# Patient Record
Sex: Female | Born: 1995 | Race: White | Hispanic: No | Marital: Married | Smoking: Never smoker
Health system: Southern US, Community
[De-identification: ages and names within clinical notes are randomized; demographics above are authoritative.]

## PROBLEM LIST (undated history)

## (undated) DIAGNOSIS — T8859XA Other complications of anesthesia, initial encounter: Secondary | ICD-10-CM

## (undated) DIAGNOSIS — R011 Cardiac murmur, unspecified: Secondary | ICD-10-CM

## (undated) HISTORY — PX: WISDOM TOOTH EXTRACTION: SHX21

## (undated) HISTORY — DX: Other complications of anesthesia, initial encounter: T88.59XA

## (undated) HISTORY — DX: Cardiac murmur, unspecified: R01.1

---
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
## 2014-06-01 ENCOUNTER — Ambulatory Visit (INDEPENDENT_AMBULATORY_CARE_PROVIDER_SITE_OTHER): Payer: 59 | Admitting: Nurse Practitioner

## 2014-06-01 ENCOUNTER — Encounter: Payer: Self-pay | Admitting: Nurse Practitioner

## 2014-06-01 VITALS — BP 108/76 | Temp 98.6°F | Ht 64.0 in | Wt 123.0 lb

## 2014-06-01 DIAGNOSIS — J069 Acute upper respiratory infection, unspecified: Secondary | ICD-10-CM

## 2014-06-01 DIAGNOSIS — B9689 Other specified bacterial agents as the cause of diseases classified elsewhere: Secondary | ICD-10-CM

## 2014-06-01 MED ORDER — CEFDINIR 300 MG PO CAPS: 300.0000 mg | ORAL_CAPSULE | Freq: Two times a day (BID) | ORAL | Status: DC

## 2014-06-01 NOTE — Patient Instructions (Signed)
Restart Nasonex and antihistamines

## 2014-06-03 ENCOUNTER — Encounter: Payer: Self-pay | Admitting: Nurse Practitioner

## 2014-06-03 NOTE — Progress Notes (Signed)
Subjective:  Presents for c/o ear pressure, occasional cough, runny nose and mild dizziness x 1 week. No fever. Sore throat with cough. Clear mucus. No headache. No wheezing.   Objective:   BP 108/76 mmHg  Temp(Src) 98.6 F (37 C)  Ht 5\' 4"  (1.626 m)  Wt 123 lb (55.792 kg)  BMI 21.10 kg/m2 NAD. Alert, oriented. TMs clear effusion. Pharynx mild erythema with green PND. Neck supple with mild adenopathy. Lungs clear. Heart RRR.  Assessment: Bacterial upper respiratory infection  Plan:  Meds ordered this encounter  Medications  . cefdinir (OMNICEF) 300 MG capsule    Sig: Take 1 capsule (300 mg total) by mouth 2 (two) times daily. For 10 days    Dispense:  20 capsule    Refill:  0    Order Specific Question:  Supervising Provider    Answer:  Mikey Kirschner [[id number]]   Restart Nasonex as directed. OTC antihistamine as directed. Call back if worsens or persists.

## 2014-08-01 ENCOUNTER — Ambulatory Visit (INDEPENDENT_AMBULATORY_CARE_PROVIDER_SITE_OTHER): Payer: 59 | Admitting: *Deleted

## 2014-08-01 DIAGNOSIS — Z111 Encounter for screening for respiratory tuberculosis: Secondary | ICD-10-CM | POA: Diagnosis not present

## 2014-08-03 LAB — TB SKIN TEST
Induration: 0 mm
TB Skin Test: NEGATIVE

## 2014-10-18 ENCOUNTER — Encounter: Payer: Self-pay | Admitting: Nurse Practitioner

## 2014-10-18 ENCOUNTER — Ambulatory Visit (INDEPENDENT_AMBULATORY_CARE_PROVIDER_SITE_OTHER): Payer: 59 | Admitting: Nurse Practitioner

## 2014-10-18 VITALS — BP 108/76 | Temp 98.7°F | Wt 119.0 lb

## 2014-10-18 DIAGNOSIS — J329 Chronic sinusitis, unspecified: Secondary | ICD-10-CM | POA: Diagnosis not present

## 2014-10-18 MED ORDER — AZITHROMYCIN 250 MG PO TABS: ORAL_TABLET | ORAL | Status: DC

## 2014-10-18 NOTE — Progress Notes (Signed)
Subjective:  Presents for c/o runny nose and sinus pressure for about a week. Sore throat, bilat ear pressure x 2 d. Now having yellow mucus. Rare cough. No wheezing. Slight headache. No fever. No vomiting, diarrhea or abd pain.  Objective:   BP 108/76 mmHg  Temp(Src) 98.7 F (37.1 C) (Oral)  Wt 119 lb (53.978 kg) NAD. Alert, oriented. TMs clear effusion. Pharynx non erythematous with green PND noted. Neck supple with mild anterior adenopathy. Lungs clear. Heart RRR.  Assessment: Rhinosinusitis  Plan:  Meds ordered this encounter  Medications  . azithromycin (ZITHROMAX Z-PAK) 250 MG tablet    Sig: Take 2 tablets (500 mg) on  Day 1,  followed by 1 tablet (250 mg) once daily on Days 2 through 5.    Dispense:  6 each    Refill:  0    Order Specific Question:  Supervising Provider    Answer:  Mikey Kirschner [[id number]]   Continue OTC meds as directed for congestion. Call back if worsens or persists.

## 2014-10-23 ENCOUNTER — Other Ambulatory Visit: Payer: Self-pay | Admitting: Nurse Practitioner

## 2014-10-23 ENCOUNTER — Telehealth: Payer: Self-pay | Admitting: Nurse Practitioner

## 2014-10-23 MED ORDER — AMOXICILLIN-POT CLAVULANATE 875-125 MG PO TABS: 1.0000 | ORAL_TABLET | Freq: Two times a day (BID) | ORAL | Status: DC

## 2014-10-23 NOTE — Telephone Encounter (Signed)
NTC: any fever? Color to mucus?

## 2014-10-23 NOTE — Telephone Encounter (Signed)
Pt seen by Hoyle Sauer on 7/27 she was told to call back if she was not better  Current symptoms, runny nose, productive cough  Finished zpak last night   [place] [facility]

## 2014-10-23 NOTE — Telephone Encounter (Signed)
Patient has no fever but drainage is yellow /green

## 2014-11-14 ENCOUNTER — Ambulatory Visit (INDEPENDENT_AMBULATORY_CARE_PROVIDER_SITE_OTHER): Payer: 59 | Admitting: Family Medicine

## 2014-11-14 VITALS — Ht 64.0 in | Wt 120.2 lb

## 2014-11-14 DIAGNOSIS — R21 Rash and other nonspecific skin eruption: Secondary | ICD-10-CM | POA: Diagnosis not present

## 2014-11-14 MED ORDER — TRIAMCINOLONE ACETONIDE 0.1 % EX CREA: 1.0000 "application " | TOPICAL_CREAM | Freq: Two times a day (BID) | CUTANEOUS | Status: DC

## 2014-11-14 NOTE — Progress Notes (Signed)
   Subjective:    Patient ID: Veronica Allen, female    DOB: 18-Sep-1995, 19 y.o.   MRN: [id number]  HPI Patient arrives with c/o itchy rash on legs.  Patient was help her grandmother today. Was in her house. Next  Get into the household it was known to have an infestation with fleas earlier in the month.  The house and been treated.   Review of Systems No headache no fever no cough    Objective:   Physical Exam  Alert vitals stable. Lungs clear. Heart regular in rhythm. Legs multiple discrete papules with hypopigmented flare all underneath the edge of her slacks      Assessment & Plan:  Impression probable flea bites discussed plan triamcinolone cream twice a day to affected area. Symptom care discussed WSL

## 2015-02-13 ENCOUNTER — Ambulatory Visit (INDEPENDENT_AMBULATORY_CARE_PROVIDER_SITE_OTHER): Payer: 59 | Admitting: Nurse Practitioner

## 2015-02-13 ENCOUNTER — Encounter: Payer: Self-pay | Admitting: Nurse Practitioner

## 2015-02-13 VITALS — BP 100/70 | Ht 64.5 in | Wt 117.4 lb

## 2015-02-13 DIAGNOSIS — Z113 Encounter for screening for infections with a predominantly sexual mode of transmission: Secondary | ICD-10-CM | POA: Diagnosis not present

## 2015-02-13 DIAGNOSIS — Z0000 Encounter for general adult medical examination without abnormal findings: Secondary | ICD-10-CM | POA: Diagnosis not present

## 2015-02-13 MED ORDER — NORETHINDRONE ACET-ETHINYL EST 1-20 MG-MCG PO TABS: 1.0000 | ORAL_TABLET | Freq: Every day | ORAL | Status: DC

## 2015-02-14 ENCOUNTER — Encounter: Payer: Self-pay | Admitting: Nurse Practitioner

## 2015-02-14 NOTE — Progress Notes (Signed)
   Subjective:    Patient ID: Veronica Allen, female    DOB: 11/04/1995, 19 y.o.   MRN: [id number]  HPI presents for her wellness exam. Cycles are usually regular although this last 1 had spotting for about 25 days which has finally resolved. Has had 2 sexual partners. Uses condoms consistently. No pelvic pain or discharge. Would like to start birth control pills. Regular vision and dental exams. Does fairly well with her diet. No regular exercise.    Review of Systems  Constitutional: Negative for activity change, appetite change and fatigue.  HENT: Negative for dental problem, ear pain, sinus pressure and sore throat.   Respiratory: Negative for cough, chest tightness, shortness of breath and wheezing.   Cardiovascular: Negative for chest pain.  Gastrointestinal: Negative for nausea, vomiting, abdominal pain, diarrhea, constipation and abdominal distention.  Genitourinary: Positive for menstrual problem. Negative for dysuria, urgency, frequency, vaginal discharge, enuresis, difficulty urinating, genital sores and pelvic pain.       Objective:   Physical Exam  Constitutional: She is oriented to person, place, and time. She appears well-developed. No distress.  HENT:  Right Ear: External ear normal.  Left Ear: External ear normal.  Mouth/Throat: Oropharynx is clear and moist.  Neck: Normal range of motion. Neck supple. No tracheal deviation present. No thyromegaly present.  Cardiovascular: Normal rate, regular rhythm and normal heart sounds.  Exam reveals no gallop.   No murmur heard. Pulmonary/Chest: Effort normal and breath sounds normal.  Abdominal: Soft. She exhibits no distension. There is no tenderness.  Genitourinary:  Defers GU exam, denies any problems.  Musculoskeletal: She exhibits no edema.  Lymphadenopathy:    She has no cervical adenopathy.  Neurological: She is alert and oriented to person, place, and time.  Skin: Skin is warm and dry. No rash noted.    Psychiatric: She has a normal mood and affect. Her behavior is normal.  Vitals reviewed.  Breast exam: Slightly dense tissue, no masses; axilla no adenopathy.        Assessment & Plan:  Routine general medical examination at a health care facility  Screen for STD (sexually transmitted disease) - Plan: GC/Chlamydia Probe Amp  Discussed risks associated with OC use. Meds ordered this encounter  Medications  . norethindrone-ethinyl estradiol (MICROGESTIN,JUNEL,LOESTRIN) 1-20 MG-MCG tablet    Sig: Take 1 tablet by mouth daily.    Dispense:  3 Package    Refill:  3    Her mother will be calling to put this on her account    Order Specific Question:  Supervising Provider    Answer:  Mikey Kirschner [[id number]]   Start first Sunday after her next cycle begins. Reviewed safe sex issues. Encouraged healthy diet and regular exercise. Return in about 1 year (around 02/13/2016) for physical. Call back sooner if any problems.

## 2015-02-16 LAB — GC/CHLAMYDIA PROBE AMP
CHLAMYDIA, DNA PROBE: NEGATIVE
NEISSERIA GONORRHOEAE BY PCR: NEGATIVE

## 2015-05-24 ENCOUNTER — Ambulatory Visit: Payer: 59 | Admitting: Family Medicine

## 2015-06-05 ENCOUNTER — Other Ambulatory Visit: Payer: Self-pay | Admitting: Nurse Practitioner

## 2015-06-05 ENCOUNTER — Telehealth: Payer: Self-pay | Admitting: Nurse Practitioner

## 2015-06-05 MED ORDER — NORGESTIMATE-ETH ESTRADIOL 0.25-35 MG-MCG PO TABS: 1.0000 | ORAL_TABLET | Freq: Every day | ORAL | Status: DC

## 2015-06-05 NOTE — Telephone Encounter (Signed)
Veronica Allen  norethindrone-ethinyl estradiol (MICROGESTIN,JUNEL,LOESTRIN) 1-20 MG-MCG tablet  Pt is stating that the pill she is on has her menstrating 2 times a month   She would like to try something else, this is her fourth month   Outpatient

## 2015-06-05 NOTE — Telephone Encounter (Signed)
I went ahead and sent in new pill. Call back if breakthrough bleeding persists after 3 months or if any heavy or prolonged bleeding.

## 2015-06-05 NOTE — Telephone Encounter (Signed)
Mom was notified. 

## 2015-06-23 HISTORY — PX: WISDOM TOOTH EXTRACTION: SHX21

## 2016-04-09 ENCOUNTER — Other Ambulatory Visit: Payer: 59 | Admitting: Adult Health

## 2016-04-17 ENCOUNTER — Other Ambulatory Visit: Payer: 59 | Admitting: Adult Health

## 2016-04-24 ENCOUNTER — Ambulatory Visit (INDEPENDENT_AMBULATORY_CARE_PROVIDER_SITE_OTHER): Payer: 59 | Admitting: Adult Health

## 2016-04-24 ENCOUNTER — Encounter: Payer: Self-pay | Admitting: Adult Health

## 2016-04-24 ENCOUNTER — Other Ambulatory Visit (HOSPITAL_COMMUNITY)
Admission: RE | Admit: 2016-04-24 | Discharge: 2016-04-24 | Disposition: A | Discharge status: Home / Self Care | Payer: 59 | Source: Ambulatory Visit | Attending: Adult Health | Admitting: Adult Health

## 2016-04-24 VITALS — BP 110/72 | HR 76 | Ht 63.0 in | Wt 126.0 lb

## 2016-04-24 DIAGNOSIS — Z01419 Encounter for gynecological examination (general) (routine) without abnormal findings: Secondary | ICD-10-CM

## 2016-04-24 DIAGNOSIS — Z113 Encounter for screening for infections with a predominantly sexual mode of transmission: Secondary | ICD-10-CM | POA: Insufficient documentation

## 2016-04-24 DIAGNOSIS — Z3041 Encounter for surveillance of contraceptive pills: Secondary | ICD-10-CM

## 2016-04-24 MED ORDER — NORGESTIMATE-ETH ESTRADIOL 0.25-35 MG-MCG PO TABS: 1.0000 | ORAL_TABLET | Freq: Every day | ORAL | 4 refills | Status: DC

## 2016-04-24 NOTE — Progress Notes (Signed)
Patient ID: Veronica Allen, female   DOB: 1995-06-20, 21 y.o.   MRN: [id number] History of Present Illness:  Jariana is a 21 year old white female in for a well woman gyn exam and first pap. PCP is Pearson Forster NP.  Current Medications, Allergies, Past Medical History, Past Surgical History, Family History and Social History were reviewed in [employer] record.     Review of Systems: Patient denies any headaches, hearing loss, fatigue, blurred vision, shortness of breath, chest pain, abdominal pain, problems with bowel movements, urination, or intercourse. No joint pain or mood swings.She is happy with her pills.    Physical Exam:BP 110/72 (BP Location: Right Arm, Patient Position: Sitting, Cuff Size: Normal)   Pulse 76   Ht 5\' 3"  (1.6 m)   Wt 126 lb (57.2 kg)   LMP 04/14/2016 (Exact Date)   BMI 22.32 kg/m  General:  Well developed, well nourished, no acute distress Skin:  Warm and dry Neck:  Midline trachea, normal thyroid, good ROM, no lymphadenopathy Lungs; Clear to auscultation bilaterally Breast:  No dominant palpable mass, retraction, or nipple discharge Cardiovascular: Regular rate and rhythm Abdomen:  Soft, non tender, no hepatosplenomegaly Pelvic:  External genitalia is normal in appearance, no lesions.  The vagina is normal in appearance. Urethra has no lesions or masses. The cervix is smooth and nulliparous, pap with GC/CHL performed.  Uterus is felt to be normal size, shape, and contour.  No adnexal masses or tenderness noted.Bladder is non tender, no masses felt. Extremities/musculoskeletal:  No swelling or varicosities noted, no clubbing or cyanosis Psych:  No mood changes, alert and cooperative,seems happy PHQ 2 score 0  Impression: 1. Encounter for gynecological examination with Papanicolaou smear of cervix   2. Encounter for surveillance of contraceptive pills       Plan: Meds ordered this encounter  Medications  .  norgestimate-ethinyl estradiol (ORTHO-CYCLEN,SPRINTEC,PREVIFEM) 0.25-35 MG-MCG tablet    Sig: Take 1 tablet by mouth daily.    Dispense:  3 Package    Refill:  4    Order Specific Question:   Supervising Provider    Answer:   Florian Buff [[id number]]   Physical in 1 year, pap in 3 if normal

## 2016-04-28 LAB — CYTOLOGY - PAP
Chlamydia: NEGATIVE
Diagnosis: NEGATIVE
NEISSERIA GONORRHEA: NEGATIVE

## 2016-10-23 ENCOUNTER — Telehealth: Payer: Self-pay | Admitting: *Deleted

## 2016-10-23 DIAGNOSIS — Z139 Encounter for screening, unspecified: Secondary | ICD-10-CM

## 2016-10-23 NOTE — Telephone Encounter (Signed)
Mailbox full, wil order TB Quantaferon

## 2016-12-17 ENCOUNTER — Telehealth: Payer: 59 | Admitting: Nurse Practitioner

## 2016-12-17 DIAGNOSIS — J0100 Acute maxillary sinusitis, unspecified: Secondary | ICD-10-CM | POA: Diagnosis not present

## 2016-12-17 MED ORDER — DOXYCYCLINE HYCLATE 100 MG PO TABS: 100.0000 mg | ORAL_TABLET | Freq: Two times a day (BID) | ORAL | 0 refills | Status: DC

## 2016-12-17 NOTE — Progress Notes (Signed)

## 2017-06-10 ENCOUNTER — Telehealth: Payer: Self-pay | Admitting: *Deleted

## 2017-06-10 ENCOUNTER — Encounter: Payer: Self-pay | Admitting: Family Medicine

## 2017-06-10 NOTE — Telephone Encounter (Signed)
Called patient and left message that I am returning her call.  

## 2017-06-11 NOTE — Telephone Encounter (Signed)
Pt reports that yesterday she had been spotting for 14 days. Yesterday had unbearable cramping. Today she is feeling better and has started her cycle. Pt states that she did missed 2 pills. Advised patient that this is what caused her spotting. She will start a new pack on Sunday. Advised that she will not need to be seen for this issue at this time. Recommended setting an alarm on her phone to take the pill at the same time everyday.

## 2017-06-12 ENCOUNTER — Encounter: Payer: Self-pay | Admitting: Obstetrics and Gynecology

## 2017-06-12 ENCOUNTER — Telehealth: Payer: Self-pay | Admitting: Family Medicine

## 2017-06-12 ENCOUNTER — Ambulatory Visit: Payer: 59 | Admitting: Obstetrics and Gynecology

## 2017-06-12 ENCOUNTER — Telehealth: Payer: Self-pay | Admitting: Obstetrics & Gynecology

## 2017-06-12 VITALS — BP 120/72 | HR 88 | Ht 63.0 in | Wt 129.0 lb

## 2017-06-12 DIAGNOSIS — Z30015 Encounter for initial prescription of vaginal ring hormonal contraceptive: Secondary | ICD-10-CM

## 2017-06-12 DIAGNOSIS — N921 Excessive and frequent menstruation with irregular cycle: Secondary | ICD-10-CM

## 2017-06-12 MED ORDER — ETONOGESTREL-ETHINYL ESTRADIOL 0.12-0.015 MG/24HR VA RING: VAGINAL_RING | VAGINAL | 4 refills | Status: DC

## 2017-06-12 NOTE — Telephone Encounter (Signed)
Please communicate to the patient is but this needs to up by gynecology.  Please let them know that I did call to gynecology asking that 1 of their staff look into this issue further.  She should give them a call.

## 2017-06-12 NOTE — Telephone Encounter (Signed)
Dr. Sallee Lange called regarding this patient. The patient spoke to one of our nurses, Estill Bamberg, yesterday about vaginal bleeding. The patient has reached out to Dr. Lance Sell office again today and is still worried about the bleeding. I spoke with Tish, and Dr. Glo Herring does have a few openings this morning if the patient would like to be seen. I tried to contact the patient. The primary number is the mom, who gave me the patient's phone number. I called but the voicemail box was full. 06/12/17 JM

## 2017-06-12 NOTE — Telephone Encounter (Signed)
I did speak with the patient's OB/GYN office and alerted them that the patient will be calling to get more advice regarding vaginal bleeding

## 2017-06-12 NOTE — Telephone Encounter (Signed)
Called number on file and it was the mothers; mother gave nurse Morgans number. Called but voicemail was full; sent reply via MyChart

## 2017-06-12 NOTE — Progress Notes (Signed)
   [facility] Visit  @DATE @            Patient name: Veronica Allen MRN [id number]  Date of birth: 19-Jan-1996  CC & HPI:  Veronica Allen is a 22 y.o. female presenting today for a breakthrough bleeding on birth control pills.  She is using a monophasic 28-day pill pack, but does miss some pills which began spotting.  She is just finishing her placebo pills she is on day 26/28-day pack  ROS:  ROS   Pertinent History Reviewed:   Reviewed: Significant for none desires pregnancy in 5 years Medical        History reviewed. No pertinent past medical history.                            Surgical Hx:    Past Surgical History:  Procedure Laterality Date  . WISDOM TOOTH EXTRACTION     Medications: Reviewed & Updated - see associated section                       Current Outpatient Medications:  .  norgestimate-ethinyl estradiol (ORTHO-CYCLEN,SPRINTEC,PREVIFEM) 0.25-35 MG-MCG tablet, Take 1 tablet by mouth daily., Disp: 3 Package, Rfl: 4   Social History: Reviewed -  reports that she has never smoked. She has never used smokeless tobacco.  Objective Findings:  Vitals: Blood pressure 120/72, pulse 88, height 5\' 3"  (1.6 m), weight 129 lb (58.5 kg), last menstrual period 06/10/2017.  PHYSICAL EXAMINATION General appearance - alert, well appearing, and in no distress, oriented to person, place, and time and normal appearing weight Mental status - alert, oriented to person, place, and time, normal mood, behavior, speech, dress, motor activity, and thought processes Chest -  Heart - normal rate and regular rhythm Abdomen - soft, nontender, nondistended, no masses or organomegaly Breasts -  Skin - normal coloration and turgor, no rashes, no suspicious skin lesions noted  PELVIC  Lengthy discussion of contraception options including IUD Nexplanon patch and NuvaRing patient selects NuvaRing Assessment & Plan:   A:  1. Contraception management 2. Active bleeding on  birth control  P:  1. Switch to NuvaRing called into [facility]

## 2017-07-19 ENCOUNTER — Encounter: Payer: Self-pay | Admitting: Obstetrics and Gynecology

## 2018-01-04 ENCOUNTER — Encounter: Payer: Self-pay | Admitting: Family Medicine

## 2018-01-06 ENCOUNTER — Other Ambulatory Visit: Payer: Self-pay | Admitting: Family Medicine

## 2018-01-06 MED ORDER — VALACYCLOVIR HCL 1 G PO TABS: ORAL_TABLET | ORAL | 6 refills | Status: DC

## 2018-04-13 DIAGNOSIS — H52223 Regular astigmatism, bilateral: Secondary | ICD-10-CM | POA: Diagnosis not present

## 2018-04-13 DIAGNOSIS — H5203 Hypermetropia, bilateral: Secondary | ICD-10-CM | POA: Diagnosis not present

## 2018-08-31 ENCOUNTER — Other Ambulatory Visit: Payer: Self-pay | Admitting: Obstetrics and Gynecology

## 2019-01-14 ENCOUNTER — Other Ambulatory Visit: Payer: Self-pay | Admitting: Family Medicine

## 2019-09-08 ENCOUNTER — Other Ambulatory Visit (HOSPITAL_COMMUNITY): Payer: Self-pay | Admitting: Obstetrics and Gynecology

## 2019-09-08 ENCOUNTER — Other Ambulatory Visit: Payer: Self-pay | Admitting: Obstetrics and Gynecology

## 2019-09-15 ENCOUNTER — Telehealth: Payer: 59 | Admitting: Physician Assistant

## 2019-09-15 DIAGNOSIS — R21 Rash and other nonspecific skin eruption: Secondary | ICD-10-CM | POA: Diagnosis not present

## 2019-09-15 MED ORDER — PREDNISONE 10 MG PO TABS: 10.0000 mg | ORAL_TABLET | Freq: Every day | ORAL | 0 refills | Status: AC

## 2019-09-15 MED ORDER — PERMETHRIN 5 % EX CREA: 1.0000 "application " | TOPICAL_CREAM | Freq: Once | CUTANEOUS | 0 refills | Status: AC

## 2019-09-15 NOTE — Progress Notes (Signed)
E Visit for Rash  We are sorry that you are not feeling well. Here is how we plan to help!  Based on what you shared with me it looks like you have contact dermatitis.  Contact dermatitis is a skin rash caused by something that touches the skin and causes irritation or inflammation.  Your skin may be red, swollen, dry, cracked, and itch.  The rash should go away in a few days but can last a few weeks.  If you get a rash, it's important to figure out what caused it so the irritant can be avoided in the future. and I have prescribed Prednisone 10 mg daily for 5 days  If the prednisone does not improve symptoms, then I have also prescribed a medication called Permethrin to help treat a rash caused by scabies which you can try as well.   If the rash spreads to the palm of your hands then you need to be seen in person to have bloodwork completed.    HOME CARE:   Take cool showers and avoid direct sunlight.  Apply cool compress or wet dressings.  Take a bath in an oatmeal bath.  Sprinkle content of one Aveeno packet under running faucet with comfortably warm water.  Bathe for 15-20 minutes, 1-2 times daily.  Pat dry with a towel. Do not rub the rash.  Use hydrocortisone cream.  Take an antihistamine like Benadryl for widespread rashes that itch.  The adult dose of Benadryl is 25-50 mg by mouth 4 times daily.  Caution:  This type of medication may cause sleepiness.  Do not drink alcohol, drive, or operate dangerous machinery while taking antihistamines.  Do not take these medications if you have prostate enlargement.  Read package instructions thoroughly on all medications that you take.  GET HELP RIGHT AWAY IF:   Symptoms don't go away after treatment.  Severe itching that persists.  If you rash spreads or swells.  If you rash begins to smell.  If it blisters and opens or develops a yellow-brown crust.  You develop a fever.  You have a sore throat.  You become short of  breath.  MAKE SURE YOU:  Understand these instructions. Will watch your condition. Will get help right away if you are not doing well or get worse.  Thank you for choosing an e-visit. Your e-visit answers were reviewed by a board certified advanced clinical practitioner to complete your personal care plan. Depending upon the condition, your plan could have included both over the counter or prescription medications. Please review your pharmacy choice. Be sure that the pharmacy you have chosen is open so that you can pick up your prescription now.  If there is a problem you may message your provider in [place] to have the prescription routed to another pharmacy. Your safety is important to [place]. If you have drug allergies check your prescription carefully.  For the next 24 hours, you can use MyChart to ask questions about today's visit, request a non-urgent call back, or ask for a work or school excuse from your e-visit provider. You will get an email in the next two days asking about your experience. I hope that your e-visit has been valuable and will speed your recovery.  Approximately 5 minutes was spent documenting and reviewing patient's chart.

## 2019-11-07 ENCOUNTER — Ambulatory Visit (INDEPENDENT_AMBULATORY_CARE_PROVIDER_SITE_OTHER): Payer: 59 | Admitting: Obstetrics and Gynecology

## 2019-11-07 ENCOUNTER — Other Ambulatory Visit (HOSPITAL_COMMUNITY)
Admission: RE | Admit: 2019-11-07 | Discharge: 2019-11-07 | Disposition: A | Discharge status: Home / Self Care | Payer: 59 | Source: Ambulatory Visit | Attending: Obstetrics and Gynecology | Admitting: Obstetrics and Gynecology

## 2019-11-07 ENCOUNTER — Other Ambulatory Visit: Payer: Self-pay

## 2019-11-07 ENCOUNTER — Encounter: Payer: Self-pay | Admitting: Obstetrics and Gynecology

## 2019-11-07 VITALS — BP 123/74 | HR 82 | Ht 64.75 in | Wt 132.6 lb

## 2019-11-07 DIAGNOSIS — Z01419 Encounter for gynecological examination (general) (routine) without abnormal findings: Secondary | ICD-10-CM | POA: Diagnosis not present

## 2019-11-07 NOTE — Progress Notes (Signed)
Patient ID: Veronica Allen, female   DOB: [birth year], 24 y.o.   MRN: [id number]   Assessment:  1.   Annual Gyn Exam 2.   Diffuse papular rash on bilateral hands, recommended Zyrtec or Claritin Plan:  1. Pap smear done, next pap due 3 years 2. Return annually or prn 3    Annual mammogram advised after age 90 Subjective:  Veronica Allen is a 24 y.o. female [place] who presents for annual exam. No LMP recorded. The patient has no complaints today. She feels comfortable performing self-breast exams on her own. She uses a NuvaRing for contraception. She does have an itchy rash on her hands. She states that she used to have pustules and was once given Prednisone.  The following portions of the patient's history were reviewed and updated as appropriate: allergies, current medications, past family history, past medical history, past social history, past surgical history and problem list. No past medical history on file.  Past Surgical History:  Procedure Laterality Date  . WISDOM TOOTH EXTRACTION       Current Outpatient Medications:  .  etonogestrel-ethinyl estradiol (NUVARING) 0.12-0.015 MG/24HR vaginal ring, INSERT VAGINALLY AND LEAVE IN PLACE FOR 3 CONSECUTIVE WEEKS, THEN REMOVE FOR 1 WEEK., Disp: 3 each, Rfl: 4 .  norgestimate-ethinyl estradiol (ORTHO-CYCLEN,SPRINTEC,PREVIFEM) 0.25-35 MG-MCG tablet, Take 1 tablet by mouth daily., Disp: 3 Package, Rfl: 4 .  valACYclovir (VALTREX) 1000 MG tablet, 2 pills at first sign of fever blister repeat 2 pills 12 hours later, Disp: 4 tablet, Rfl: 6  Review of Systems Constitutional: negative Gastrointestinal: negative Genitourinary: negative  Objective:  There were no vitals taken for this visit.   BMI: There is no height or weight on file to calculate BMI.  General Appearance: Alert, appropriate appearance for age. No acute distress HEENT: Grossly normal Neck / Thyroid:  Cardiovascular: RRR; normal S1, S2, no murmur Lungs: CTA  bilaterally Back: No CVAT Breast Exam: DEFERRED Gastrointestinal: Soft, non-tender, no masses or organomegaly, normal bowel sounds. Pelvic Exam: Vulva and vagina appear normal. Bimanual exam reveals normal uterus and adnexa. Rectovaginal: not indicated Lymphatic Exam: Non-palpable nodes in neck, clavicular, axillary, or inguinal regions  Skin: Diffuse papular, erythemic, 2 mm or less rash over the dorsal aspect of the bilateral hands. Blanched with compression. Neurologic: Normal gait and speech, no tremor  Psychiatric: Alert an.jfs d oriented, appropriate affect.  Urinalysis:Not done  Mallory Shirk. MD Pgr [phone] 8:49 AM  By signing my name below, I, De Burrs, attest that this documentation has been prepared under the direction and in the presence of Jonnie Kind, MD. Electronically Signed: De Burrs, Medical Scribe. 11/07/19. 8:49 AM.  I personally performed the services described in this documentation, which was SCRIBED in my presence. The recorded information has been reviewed and considered accurate. It has been edited as necessary during review. Jonnie Kind, MD

## 2019-11-08 LAB — CYTOLOGY - PAP: Diagnosis: NEGATIVE

## 2019-12-10 ENCOUNTER — Other Ambulatory Visit: Payer: 59

## 2019-12-29 ENCOUNTER — Telehealth: Payer: 59 | Admitting: Physician Assistant

## 2019-12-29 DIAGNOSIS — H9201 Otalgia, right ear: Secondary | ICD-10-CM

## 2019-12-29 MED ORDER — OFLOXACIN 0.3 % OT SOLN: 5.0000 [drp] | Freq: Every day | OTIC | 0 refills | Status: DC

## 2019-12-29 NOTE — Progress Notes (Signed)
E Visit for Swimmer's Ear  We are sorry that you are not feeling well. Here is how we plan to help!  Based on what you have shared with me it looks like you have swimmers ear. Swimmer's ear is a redness or swelling, irritation, or infection of your outer ear canal.  These symptoms usually occur within a few days of swimming.  Your ear canal is a tube that goes from the opening of the ear to the eardrum.  When water stays in your ear canal, germs can grow.  This is a painful condition that often happens to children and swimmers of all ages.  It is not contagious and oral antibiotics are not required to treat uncomplicated swimmer's ear.  The usual symptoms include: Itching inside the ear, Redness or a sense of swelling in the ear, Pain when the ear is tugged on when pressure is placed on the ear, Pus draining from the infected ear.    have prescribed: Ciprofloxin 0.3% otic suspension 5 drops in affected ear daily for 3-5 days  In certain cases swimmer's ear may progress to a more serious bacterial infection of the middle or inner ear.  If you have a fever 102 and up and significantly worsening symptoms, this could indicate a more serious infection moving to the middle/inner and needs face to face evaluation in an office by a provider.  Your symptoms should improve over the next 3 days and should resolve in about 7 days.  HOME CARE:   Wash your hands frequently.  Do not place the tip of the bottle on your ear or touch it with your fingers.  You can take Acetominophen 650 mg every 4-6 hours as needed for pain.  If pain is severe or moderate, you can apply a heating pad (set on low) or hot water bottle (wrapped in a towel) to outer ear for 20 minutes.  This will also increase drainage.  Avoid ear plugs  Do not use Q-tips  After showers, help the water run out by tilting your head to one side.  GET HELP RIGHT AWAY IF:   Fever is over 102.2 degrees.  You develop progressive ear pain or  hearing loss.  Ear symptoms persist longer than 3 days after treatment.  MAKE SURE YOU:   Understand these instructions.  Will watch your condition.  Will get help right away if you are not doing well or get worse.  TO PREVENT SWIMMER'S EAR:  Use a bathing cap or custom fitted swim molds to keep your ears dry.  Towel off after swimming to dry your ears.  Tilt your head or pull your earlobes to allow the water to escape your ear canal.  If there is still water in your ears, consider using a hairdryer on the lowest setting.  Thank you for choosing an e-visit. Your e-visit answers were reviewed by a board certified advanced clinical practitioner to complete your personal care plan. Depending upon the condition, your plan could have included both over the counter or prescription medications. Please review your pharmacy choice. Be sure that the pharmacy you have chosen is open so that you can pick up your prescription now.  If there is a problem you may message your provider in [place] to have the prescription routed to another pharmacy. Your safety is important to [place]. If you have drug allergies check your prescription carefully.  For the next 24 hours, you can use MyChart to ask questions about today's visit, request a non-urgent  call back, or ask for a work or school excuse from your e-visit provider. You will get an email in the next two days asking about your experience. I hope that your e-visit has been valuable and will speed your recovery.  Greater than 5 minutes, yet less than 10 minutes of time have been spent researching, coordinating, and implementing care for this patient today.  Inda Coke PA-C

## 2020-01-04 ENCOUNTER — Telehealth: Payer: 59 | Admitting: Family

## 2020-01-04 DIAGNOSIS — B002 Herpesviral gingivostomatitis and pharyngotonsillitis: Secondary | ICD-10-CM | POA: Diagnosis not present

## 2020-01-04 MED ORDER — VALACYCLOVIR HCL 1 G PO TABS: 2000.0000 mg | ORAL_TABLET | Freq: Two times a day (BID) | ORAL | 1 refills | Status: DC

## 2020-01-04 NOTE — Progress Notes (Signed)

## 2020-01-10 ENCOUNTER — Encounter: Payer: Self-pay | Admitting: Family Medicine

## 2020-01-10 DIAGNOSIS — Z111 Encounter for screening for respiratory tuberculosis: Secondary | ICD-10-CM | POA: Diagnosis not present

## 2020-01-10 NOTE — Addendum Note (Signed)
Addended by: Vicente Males on: 01/10/2020 02:10 PM   Modules accepted: Orders

## 2020-01-10 NOTE — Telephone Encounter (Signed)
Nurses Please work with the patient to order the TB test mentioned([place] TB test) This can be done through [facility] thank you

## 2020-01-13 ENCOUNTER — Encounter: Payer: Self-pay | Admitting: Family Medicine

## 2020-01-13 LAB — QUANTIFERON-TB GOLD PLUS
QuantiFERON Mitogen Value: >10 IU/mL
QuantiFERON Nil Value: 0.01 IU/mL
QuantiFERON TB1 Ag Value: 0 IU/mL
QuantiFERON TB2 Ag Value: 0.01 IU/mL
QuantiFERON-TB Gold Plus: NEGATIVE

## 2020-03-24 ENCOUNTER — Ambulatory Visit (INDEPENDENT_AMBULATORY_CARE_PROVIDER_SITE_OTHER): Payer: 59

## 2020-03-24 ENCOUNTER — Encounter: Payer: Self-pay | Admitting: Emergency Medicine

## 2020-03-24 ENCOUNTER — Other Ambulatory Visit: Payer: Self-pay

## 2020-03-24 ENCOUNTER — Ambulatory Visit
Admission: EM | Admit: 2020-03-24 | Discharge: 2020-03-24 | Disposition: A | Discharge status: Home / Self Care | Payer: 59 | Attending: Internal Medicine | Admitting: Internal Medicine

## 2020-03-24 DIAGNOSIS — M79671 Pain in right foot: Secondary | ICD-10-CM | POA: Diagnosis not present

## 2020-03-24 MED ORDER — DICLOFENAC SODIUM 1 % EX GEL: 2.0000 g | Freq: Four times a day (QID) | CUTANEOUS | 0 refills | Status: DC

## 2020-03-24 NOTE — ED Triage Notes (Signed)
RT foot pain on top of foot x 3-4 days. Denies any injury.

## 2020-03-24 NOTE — ED Provider Notes (Signed)
[facility]    CSN: [id number] Arrival date & time: 03/24/20  1424      History   Chief Complaint Chief Complaint  Patient presents with  . Foot Pain    HPI Veronica Allen is a 25 y.o. female who presents with hx of dorsal R foot pain x 3-4 days. Denies injuring herself in any way. She has tried wearing looser shoes thinking maybe there ones before were tight on this are but did not make a difference. 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 She also applied ace bandage and did not help . She does not exercise and has not walked more than usual.     History reviewed. No pertinent past medical history.  There are no problems to display for this patient.   Past Surgical History:  Procedure Laterality Date  . WISDOM TOOTH EXTRACTION      OB History    Gravida  0   Para  0   Term  0   Preterm  0   AB  0   Living  0     SAB  0   IAB  0   Ectopic  0   Multiple  0   Live Births  0            Home Medications    Prior to Admission medications   Medication Sig Start Date End Date Taking? Authorizing Provider  diclofenac Sodium (VOLTAREN) 1 % GEL Apply 2 g topically 4 (four) times daily. For R foot pain 03/24/20  Yes Rodriguez-Southworth, Nettie Elm, PA-C  etonogestrel-ethinyl estradiol (NUVARING) 0.12-0.015 MG/24HR vaginal ring INSERT VAGINALLY AND LEAVE IN PLACE FOR 3 CONSECUTIVE WEEKS, THEN REMOVE FOR 1 WEEK. 09/08/19   Tilda Burrow, MD  ofloxacin (FLOXIN) 0.3 % OTIC solution Place 5 drops into the right ear daily. 12/29/19   Jarold Motto, PA  valACYclovir (VALTREX) 1000 MG tablet Take 2 tablets (2,000 mg total) by mouth 2 (two) times daily. 01/04/20   Junie Spencer, FNP    Family History Family History  Problem Relation Age of Onset  . Diabetes Paternal Grandfather   . Diabetes Paternal Grandmother   . Cancer Maternal Grandfather        lung    Social History Social History   Tobacco Use  .  Smoking status: Never Smoker  . Smokeless tobacco: Never Used  Vaping Use  . Vaping Use: Never used  Substance Use Topics  . Alcohol use: No  . Drug use: No     Allergies   Augmentin [amoxicillin-pot clavulanate]   Review of Systems Review of Systems  Constitutional: Negative for activity change.  Musculoskeletal: Positive for arthralgias and gait problem.  Skin: Negative for color change, pallor, rash and wound.  Neurological: Negative for weakness and numbness.     Physical Exam Triage Vital Signs ED Triage Vitals  Enc Vitals Group     BP 03/24/20 1547 111/70     Pulse Rate 03/24/20 1547 75     Resp 03/24/20 1547 18     Temp 03/24/20 1547 98.1 F (36.7 C)     Temp Source 03/24/20 1547 Oral     SpO2 03/24/20 1547 99 %     Weight --      Height --      Head Circumference --      Peak Flow --      Pain Score 03/24/20 1545 4  Pain Loc --      Pain Edu? --      Excl. in [place]? --    No data found.  Updated Vital Signs BP 111/70 (BP Location: Right Arm)   Pulse 75   Temp 98.1 F (36.7 C) (Oral)   Resp 18   LMP 03/21/2020   SpO2 99%   Visual Acuity Right Eye Distance:   Left Eye Distance:   Bilateral Distance:    Right Eye Near:   Left Eye Near:    Bilateral Near:     Physical Exam Vitals and nursing note reviewed.  Constitutional:      General: She is not in acute distress.    Appearance: She is normal weight. She is not toxic-appearing.  HENT:     Head: Normocephalic.     Right Ear: External ear normal.  Eyes:     General: No scleral icterus.    Conjunctiva/sclera: Conjunctivae normal.  Cardiovascular:     Pulses: Normal pulses.  Pulmonary:     Effort: Pulmonary effort is normal.  Musculoskeletal:        General: Tenderness present. No swelling, deformity or signs of injury. Normal range of motion.     Cervical back: Neck supple.     Right lower leg: No edema.     Left lower leg: No edema.     Comments: R FOOT- tender between 3rd and 4th  metatarsals more so than on the bone areas  Skin:    General: Skin is warm and dry.     Capillary Refill: Capillary refill takes less than 2 seconds.     Findings: No bruising, erythema or rash.  Neurological:     Mental Status: She is alert and oriented to person, place, and time.     Sensory: No sensory deficit.     Motor: No weakness.     Gait: Gait normal.  Psychiatric:        Mood and Affect: Mood normal.        Behavior: Behavior normal.        Thought Content: Thought content normal.        Judgment: Judgment normal.      UC Treatments / Results  Labs (all labs ordered are listed, but only abnormal results are displayed) Labs Reviewed - No data to display  EKG   Radiology DG Foot Complete Right  Result Date: 03/24/2020 CLINICAL DATA:  Right foot pain EXAM: RIGHT FOOT COMPLETE - 3+ VIEW COMPARISON:  None. FINDINGS: There is no evidence of fracture or dislocation. There is no evidence of arthropathy or other focal bone abnormality. Soft tissues are unremarkable. IMPRESSION: Negative. Electronically Signed   By: Rolm Baptise M.D.   On: 03/24/2020 16:16    Procedures Procedures (including critical care time)  Medications Ordered in UC Medications - No data to display  Initial Impression / Assessment and Plan / UC Course  I have reviewed the triage vital signs and the nursing notes. R foot pain, questionable Morton's Neuroma. I will have her try a post op shoe 24/7 for 7 days and Voltaren gel as noted, and needs to Fu with Neurologist if this persists.  Pertinent labs & imaging results that were available during my care of the patient were reviewed by me and considered in my medical decision making (see chart for details).  Final Clinical Impressions(s) / UC Diagnoses   Final diagnoses:  Acute foot pain, right     Discharge Instructions  Wear the post op shoe for 7-10 days and see if this helps     ED Prescriptions    Medication Sig Dispense Auth.  Provider   diclofenac Sodium (VOLTAREN) 1 % GEL Apply 2 g topically 4 (four) times daily. For R foot pain 350 g Rodriguez-Southworth, Nettie Elm, PA-C     PDMP not reviewed this encounter.   Garey Ham, Cordelia Poche 03/24/20 2101

## 2020-03-24 NOTE — Discharge Instructions (Signed)
Wear the post op shoe for 7-10 days and see if this helps

## 2020-03-26 ENCOUNTER — Encounter: Payer: Self-pay | Admitting: Family Medicine

## 2020-03-27 NOTE — Telephone Encounter (Signed)
I reviewed over the urgent care notes This is more likely a Morton's neuroma This is not something that neurology necessarily will handle I would recommend podiatry Try at foot center would be a good referral source May referral if patient is interested

## 2020-03-28 ENCOUNTER — Other Ambulatory Visit: Payer: Self-pay | Admitting: *Deleted

## 2020-03-28 DIAGNOSIS — G5760 Lesion of plantar nerve, unspecified lower limb: Secondary | ICD-10-CM

## 2020-03-29 ENCOUNTER — Telehealth: Payer: Self-pay | Admitting: Family Medicine

## 2020-04-09 ENCOUNTER — Ambulatory Visit: Payer: 59 | Admitting: Podiatry

## 2020-04-17 ENCOUNTER — Other Ambulatory Visit: Payer: Self-pay | Admitting: *Deleted

## 2020-04-17 ENCOUNTER — Other Ambulatory Visit: Payer: Self-pay | Admitting: Family

## 2020-04-17 ENCOUNTER — Encounter: Payer: Self-pay | Admitting: Family Medicine

## 2020-04-17 ENCOUNTER — Other Ambulatory Visit (HOSPITAL_COMMUNITY): Payer: Self-pay | Admitting: Family Medicine

## 2020-04-17 MED ORDER — VALACYCLOVIR HCL 1 G PO TABS: ORAL_TABLET | ORAL | 0 refills | Status: DC

## 2020-04-17 MED ORDER — VALACYCLOVIR HCL 500 MG PO TABS: 500.0000 mg | ORAL_TABLET | Freq: Every day | ORAL | 5 refills | Status: DC

## 2020-04-17 NOTE — Telephone Encounter (Signed)
Nurses As for the cold sores valacyclovir 500 mg 1 every single day will help suppress the cold sores.  The current cold sore in order to go away would need 1000 mg, take 2 pills now then take 2 pills 12 hours later, may send in refill of that  Once she is finished with the 1000 mg she may start taking valacyclovir 500 mg 1 every day, #30, 5 refills  As for the headaches I would recommend a headache calendar to keep track of the headaches and I would recommend a formal evaluation for that/office visit consultation

## 2020-05-01 ENCOUNTER — Other Ambulatory Visit: Payer: Self-pay | Admitting: Family Medicine

## 2020-05-01 ENCOUNTER — Encounter: Payer: Self-pay | Admitting: Family Medicine

## 2020-05-01 ENCOUNTER — Other Ambulatory Visit: Payer: Self-pay

## 2020-05-01 ENCOUNTER — Telehealth: Payer: 59 | Admitting: Family Medicine

## 2020-05-01 ENCOUNTER — Telehealth: Payer: Self-pay | Admitting: *Deleted

## 2020-05-01 DIAGNOSIS — G43009 Migraine without aura, not intractable, without status migrainosus: Secondary | ICD-10-CM | POA: Diagnosis not present

## 2020-05-01 DIAGNOSIS — G43909 Migraine, unspecified, not intractable, without status migrainosus: Secondary | ICD-10-CM

## 2020-05-01 HISTORY — DX: Migraine, unspecified, not intractable, without status migrainosus: G43.909

## 2020-05-01 MED ORDER — SUMATRIPTAN SUCCINATE 50 MG PO TABS: 50.0000 mg | ORAL_TABLET | ORAL | 1 refills | Status: DC | PRN

## 2020-05-01 MED ORDER — ONDANSETRON HCL 8 MG PO TABS: 8.0000 mg | ORAL_TABLET | Freq: Three times a day (TID) | ORAL | 1 refills | Status: DC | PRN

## 2020-05-01 MED FILL — SUMATRIPTAN SUCCINATE 50 MG: 50 | 30 days supply | Qty: 10 | Fill #0

## 2020-05-01 MED FILL — ONDANSETRON HCL 8 MG TABLET: 8 | 6 days supply | Qty: 20 | Fill #0

## 2020-05-01 NOTE — Progress Notes (Signed)
   Subjective:    Patient ID: Veronica Allen, female    DOB: [birth year], 25 y.o.   MRN: [id number]  HPI  Patient calls to discuss migraines. Patient states she is interested on having a medication on hand to take when she has a migraine  Patient states there is a family history of migraines When her mother was treated with Imitrex she had strokelike symptoms had to go to the ER She wonders what this could happen to her We did discuss how this is a rare side effect that it is not a genetic side effect and given that she does not smoke she is unlikely to have this but if it did occur she is to go to the ER alternative treatments would be ibuprofen or Aleve she is interested in trying the Imitrex  We did discuss possibility of using IUD rather than NuvaRing but she states her gynecologist wanted her to use the NuvaRing she states the headaches have been going on longer than the utilization of NuvaRing and she states the headaches have not become worse with this treatment  Virtual Visit via Video Note  I connected with Veronica Allen on 05/01/20 at  2:00 PM EST by a video enabled telemedicine application and verified that I am speaking with the correct person using two identifiers.  Location: Patient: home Provider: office   I discussed the limitations of evaluation and management by telemedicine and the availability of in person appointments. The patient expressed understanding and agreed to proceed.  History of Present Illness:    Observations/Objective:   Assessment and Plan:   Follow Up Instructions:    I discussed the assessment and treatment plan with the patient. The patient was provided an opportunity to ask questions and all were answered. The patient agreed with the plan and demonstrated an understanding of the instructions.   The patient was advised to call back or seek an in-person evaluation if the symptoms worsen or if the condition fails to improve as  anticipated.  I provided 20 minutes of non-face-to-face time during this encounter.  Typically these headaches hit her throbbing cause nausea the only way to get relief is to lay down rest or occasionally throw up Patient did have one episode of vomiting with the headache several months back.   Review of Systems Please see above    Objective:   Physical Exam  Today's visit was via telephone Physical exam was not possible for this visit       Assessment & Plan:  Migraines She will keep a headache diary she will give [place] an update within 1 month how it is going She will use Imitrex 1 at the first sign of migraine may repeat 2 hours later if having ongoing symptoms Zofran for nausea  If the headaches become more severe or more complex we would want to do a in person visit Keep well-hydrated Hold off on daily medications currently Patient will notify us if any questions or concerns

## 2020-05-01 NOTE — Patient Instructions (Signed)

## 2020-05-01 NOTE — Telephone Encounter (Signed)
Ms. Veronica Allen are scheduled for a virtual visit with your provider today.    Just as we do with appointments in the office, we must obtain your consent to participate.  Your consent will be active for this visit and any virtual visit you may have with one of our providers in the next 365 days.    If you have a MyChart account, I can also send a copy of this consent to you electronically.  All virtual visits are billed to your insurance company just like a traditional visit in the office.  As this is a virtual visit, video technology does not allow for your provider to perform a traditional examination.  This may limit your provider's ability to fully assess your condition.  If your provider identifies any concerns that need to be evaluated in person or the need to arrange testing such as labs, EKG, etc, we will make arrangements to do so.    Although advances in technology are sophisticated, we cannot ensure that it will always work on either your end or our end.  If the connection with a video visit is poor, we may have to switch to a telephone visit.  With either a video or telephone visit, we are not always able to ensure that we have a secure connection.   I need to obtain your verbal consent now.   Are you willing to proceed with your visit today?   Veronica Allen has provided verbal consent on 05/01/2020 for a virtual visit (video or telephone).

## 2020-05-03 ENCOUNTER — Ambulatory Visit: Payer: 59 | Admitting: Podiatry

## 2020-05-03 ENCOUNTER — Other Ambulatory Visit: Payer: Self-pay

## 2020-05-03 DIAGNOSIS — M79671 Pain in right foot: Secondary | ICD-10-CM

## 2020-05-03 DIAGNOSIS — D3610 Benign neoplasm of peripheral nerves and autonomic nervous system, unspecified: Secondary | ICD-10-CM | POA: Diagnosis not present

## 2020-05-03 NOTE — Patient Instructions (Signed)

## 2020-05-07 DIAGNOSIS — D3610 Benign neoplasm of peripheral nerves and autonomic nervous system, unspecified: Secondary | ICD-10-CM | POA: Insufficient documentation

## 2020-05-07 NOTE — Progress Notes (Signed)
Subjective:   Patient ID: Veronica Allen, female   DOB: 25 y.o.   MRN: [id number]   HPI 25 year old female presents the office today for concerns of possible neuroma in the right foot.  She states that she originally went to urgent care on March 24, 2020 at that point she was placed into a boot.  She is also doing anti-inflammatories.  She says overall she is feeling better and since she is coming to the boot she has not had any significant pain.  Currently denies any swelling or redness and no numbness or tingling in the toes.  She denies any recent injury.  She is a nurse   Review of Systems  All other systems reviewed and are negative.  Past Medical History:  Diagnosis Date  . Migraine 05/01/2020    Past Surgical History:  Procedure Laterality Date  . WISDOM TOOTH EXTRACTION       Current Outpatient Medications:  .  valACYclovir (VALTREX) 500 MG tablet, Take 1 tablet (500 mg total) by mouth daily., Disp: 30 tablet, Rfl: 5 .  diclofenac Sodium (VOLTAREN) 1 % GEL, Apply 2 g topically 4 (four) times daily. For R foot pain, Disp: 350 g, Rfl: 0 .  etonogestrel-ethinyl estradiol (NUVARING) 0.12-0.015 MG/24HR vaginal ring, INSERT VAGINALLY AND LEAVE IN PLACE FOR 3 CONSECUTIVE WEEKS, THEN REMOVE FOR 1 WEEK., Disp: 3 each, Rfl: 4 .  ofloxacin (FLOXIN) 0.3 % OTIC solution, Place 5 drops into the right ear daily. (Patient not taking: Reported on 05/01/2020), Disp: 5 mL, Rfl: 0 .  ondansetron (ZOFRAN) 8 MG tablet, Take 1 tablet (8 mg total) by mouth every 8 (eight) hours as needed for nausea., Disp: 20 tablet, Rfl: 1 .  SUMAtriptan (IMITREX) 50 MG tablet, Take 1 tablet (50 mg total) by mouth every 2 (two) hours as needed for migraine or headache. May repeat in 2 hours if headache persists or recurs., Disp: 10 tablet, Rfl: 1 .  valACYclovir (VALTREX) 1000 MG tablet, Take 2 tablets now and then take 2 tablets 12 hours later, Disp: 4 tablet, Rfl: 0  Allergies  Allergen Reactions  . Augmentin  [Amoxicillin-Pot Clavulanate] Rash          Objective:  Physical Exam  General: AAO x3, NAD  Dermatological: Skin is warm, dry and supple bilateral. There are no open sores, no preulcerative lesions, no rash or signs of infection present.  Vascular: Dorsalis Pedis artery and Posterior Tibial artery pedal pulses are 2/4 bilateral with immedate capillary fill time. There is no pain with calf compression, swelling, warmth, erythema.   Neruologic: Grossly intact via light touch bilateral.  Negative Tinel's sign.  Musculoskeletal: On today's exam unable to elicit any area of pinpoint tenderness.  Not able to palpate any neuroma.  Flexor, extensor tendons appear to be intact.  There is no edema, erythema.  Muscular strength 5/5 in all groups tested bilateral.  Gait: Unassisted, Nonantalgic.       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     Plan:  -Treatment options discussed including all alternatives, risks, and complications -Etiology of symptoms were discussed -Reviewed the previous x-rays. -On today's exam I am not able to appreciate a neuroma and she is not having any tenderness.  Dispensed neuroma pads and we discussed shoe modifications to help prevent reoccurrence.  If symptoms recur we discussed other options we will monitor.  Follow-up as needed.  Trula Slade DPM

## 2020-05-10 ENCOUNTER — Telehealth: Payer: Self-pay | Admitting: Adult Health

## 2020-05-10 MED ORDER — LEVONORGESTREL 1.5 MG PO TABS: 1.5000 mg | ORAL_TABLET | Freq: Once | ORAL | 0 refills | Status: AC

## 2020-05-10 MED ORDER — ETONOGESTREL-ETHINYL ESTRADIOL 0.12-0.015 MG/24HR VA RING
1.0000 | VAGINAL_RING | VAGINAL | 11 refills | Status: DC
Start: 2020-05-10 — End: 2021-06-07

## 2020-05-10 NOTE — Addendum Note (Signed)
Addended by: Derrek Monaco A on: 05/10/2020 11:53 AM   Modules accepted: Orders

## 2020-05-10 NOTE — Telephone Encounter (Signed)
Patient called stating that she has seen Dr. Glo Herring last year and he placed her on the nuva ring. Patient states that she has placed one In and she got a heavier than usual period and she noticed that her Nuva ring was gone. Pt would like a call back regarding this.

## 2020-05-10 NOTE — Telephone Encounter (Signed)
So do HPT and if negative do Plan B and place new ring in vagina

## 2020-05-10 NOTE — Telephone Encounter (Signed)
Patient stated that she returned [name]'s call.

## 2020-05-10 NOTE — Telephone Encounter (Signed)
Left message @ 9:59 am. JSY

## 2020-05-10 NOTE — Telephone Encounter (Signed)
Pt is on NuvaRing. She placed the ring 2 weeks ago. Pt started spotting yesterday and bleeding is much heavier today. Today, she realized NuvaRing was not in place. Pt had sex yesterday. Pt was advised will need to see if her period starts. If it doesn't, will need to do a pregnancy test. Pt did put another ring in this am. Any further recommendations? Thanks! [place]

## 2020-06-12 ENCOUNTER — Other Ambulatory Visit (HOSPITAL_BASED_OUTPATIENT_CLINIC_OR_DEPARTMENT_OTHER): Payer: Self-pay

## 2020-06-23 ENCOUNTER — Other Ambulatory Visit (HOSPITAL_COMMUNITY): Payer: Self-pay

## 2020-06-29 MED FILL — Valacyclovir HCl Tab 500 MG: ORAL | 30 days supply | Qty: 30 | Fill #0 | Status: AC

## 2020-07-02 ENCOUNTER — Other Ambulatory Visit (HOSPITAL_COMMUNITY): Payer: Self-pay

## 2020-07-03 ENCOUNTER — Other Ambulatory Visit (HOSPITAL_COMMUNITY): Payer: Self-pay

## 2020-07-06 ENCOUNTER — Other Ambulatory Visit (HOSPITAL_COMMUNITY): Payer: Self-pay

## 2020-07-10 ENCOUNTER — Other Ambulatory Visit (HOSPITAL_COMMUNITY): Payer: Self-pay

## 2020-07-25 MED FILL — Valacyclovir HCl Tab 500 MG: ORAL | 30 days supply | Qty: 30 | Fill #1 | Status: AC

## 2020-07-26 ENCOUNTER — Other Ambulatory Visit (HOSPITAL_COMMUNITY): Payer: Self-pay

## 2020-08-19 MED FILL — Valacyclovir HCl Tab 500 MG: ORAL | 30 days supply | Qty: 30 | Fill #2 | Status: AC

## 2020-08-21 ENCOUNTER — Other Ambulatory Visit (HOSPITAL_COMMUNITY): Payer: Self-pay

## 2020-08-22 ENCOUNTER — Other Ambulatory Visit (HOSPITAL_COMMUNITY): Payer: Self-pay

## 2020-08-24 ENCOUNTER — Encounter: Payer: Self-pay | Admitting: Family Medicine

## 2020-08-27 ENCOUNTER — Other Ambulatory Visit (HOSPITAL_COMMUNITY): Payer: Self-pay

## 2020-08-27 MED FILL — Etonogestrel-Ethinyl Estradiol VA Ring 0.12-0.015 MG/24HR: VAGINAL | Qty: 3 | Fill #0 | Status: CN

## 2020-08-27 NOTE — Telephone Encounter (Signed)
Nurses  The medication that she is using is Valtrex  If it is for a outbreak such as a cold sore I recommend the 1000 mg Valtrex 2 tablets now then repeat in 12 hours this prescription is already within her system please send in 5 refills  If it is to suppress them from coming at all I recommend Valtrex 500 mg 1 daily #30 with 5 refills  If she needs both send in both  Please assist patient in follow through and complete her request thank you

## 2020-08-30 ENCOUNTER — Other Ambulatory Visit (HOSPITAL_COMMUNITY): Payer: Self-pay

## 2020-08-30 MED ORDER — VALACYCLOVIR HCL 500 MG PO TABS
500.0000 mg | ORAL_TABLET | Freq: Every day | ORAL | 5 refills | Status: DC
  Filled 2020-08-30 – 2020-09-17 (×2): qty 30, 30d supply, fill #0
  Filled 2020-10-10: qty 30, 30d supply, fill #1
  Filled 2020-11-06: qty 30, 30d supply, fill #2
  Filled 2020-12-04: qty 30, 30d supply, fill #3
  Filled 2020-12-31: qty 30, 30d supply, fill #4
  Filled 2021-01-28: qty 30, 30d supply, fill #5

## 2020-08-30 NOTE — Addendum Note (Signed)
Addended by: Carmelina Noun on: 08/30/2020 08:23 AM   Modules accepted: Orders

## 2020-09-17 ENCOUNTER — Other Ambulatory Visit (HOSPITAL_COMMUNITY): Payer: Self-pay

## 2020-09-26 ENCOUNTER — Other Ambulatory Visit (HOSPITAL_COMMUNITY): Payer: Self-pay

## 2020-10-09 ENCOUNTER — Encounter: Payer: Self-pay | Admitting: Obstetrics & Gynecology

## 2020-10-09 ENCOUNTER — Other Ambulatory Visit: Payer: Self-pay

## 2020-10-09 ENCOUNTER — Ambulatory Visit: Payer: 59 | Admitting: Obstetrics & Gynecology

## 2020-10-09 ENCOUNTER — Other Ambulatory Visit (HOSPITAL_COMMUNITY): Payer: Self-pay

## 2020-10-09 VITALS — BP 135/88 | HR 102 | Ht 63.0 in | Wt 136.0 lb

## 2020-10-09 DIAGNOSIS — N923 Ovulation bleeding: Secondary | ICD-10-CM

## 2020-10-09 MED ORDER — ETONOGESTREL-ETHINYL ESTRADIOL 0.12-0.015 MG/24HR VA RING
VAGINAL_RING | VAGINAL | 4 refills | Status: DC
  Filled 2020-10-09: qty 3, 84d supply, fill #0
  Filled 2021-01-28: qty 3, 84d supply, fill #1
  Filled 2021-05-02: qty 3, 84d supply, fill #2

## 2020-10-09 NOTE — Progress Notes (Signed)
Chief Complaint  Patient presents with   Contraception    Having bleeding on and off. Took Plan B in February      25 y.o. G0P0000 No LMP recorded. The current method of family planning is NuvaRing vaginal inserts.  Outpatient Encounter Medications as of 10/09/2020  Medication Sig   etonogestrel-ethinyl estradiol (NUVARING) 0.12-0.015 MG/24HR vaginal ring Place 1 each vaginally every 21 ( twenty-one) days. Insert one (1) ring vaginally and leave in place for three (3) weeks, then remove for one (1) week.   etonogestrel-ethinyl estradiol (NUVARING) 0.12-0.015 MG/24HR vaginal ring Insert vaginally and leave in place for 3 consecutive weeks, then remove for 1 week.   valACYclovir (VALTREX) 500 MG tablet Take 1 tablet (500 mg total) by mouth daily.   diclofenac Sodium (VOLTAREN) 1 % GEL Apply 2 g topically 4 (four) times daily. For R foot pain (Patient not taking: Reported on 10/09/2020)   ondansetron (ZOFRAN) 8 MG tablet TAKE 1 TABLET BY MOUTH EVERY 8 HOURS AS NEEDED FOR NAUSEA. (Patient not taking: Reported on 10/09/2020)   SUMAtriptan (IMITREX) 50 MG tablet TAKE 1 TABLET BY MOUTH EVERY 2 HOURS AS NEEDED FOR MIGRAINE OR HEADACHE. MAY REPEAT IN 2 HOURS IF HEADACHE PERSISTS OR RECURS. MAX 2 TABLETS PER DAY. (Patient not taking: Reported on 10/09/2020)   [DISCONTINUED] etonogestrel-ethinyl estradiol (NUVARING) 0.12-0.015 MG/24HR vaginal ring INSERT VAGINALLY AND LEAVE IN PLACE FOR 3 CONSECUTIVE WEEKS, THEN REMOVE FOR 1 WEEK.   [DISCONTINUED] ofloxacin (FLOXIN) 0.3 % OTIC solution Place 5 drops into the right ear daily. (Patient not taking: Reported on 05/01/2020)   [DISCONTINUED] valACYclovir (VALTREX) 1000 MG tablet Take 2 tablets now and then take 2 tablets 12 hours later   [DISCONTINUED] valACYclovir (VALTREX) 1000 MG tablet TAKE 2 TABLETS BY MOUTH NOW AND THEN TAKE 2 TABLETS 12 HOURS LATER.   [DISCONTINUED] valACYclovir (VALTREX) 500 MG tablet Take 1 tablet (500 mg total) by mouth daily.    [DISCONTINUED] valACYclovir (VALTREX) 500 MG tablet TAKE 1 TABLET (500 MG TOTAL) BY MOUTH DAILY.   No facility-administered encounter medications on file as of 10/09/2020.    Subjective Pt has been having inter menstrual spotting since February  Generally uses nuva ring 24 in 4 out Menses are improved bleeding and cramping both Was doing 24/4 now 21/7 Spotting is pink no painnever red never brown Not intercourse related  Past Medical History:  Diagnosis Date   Migraine 05/01/2020    Past Surgical History:  Procedure Laterality Date   WISDOM TOOTH EXTRACTION      OB History     Gravida  0   Para  0   Term  0   Preterm  0   AB  0   Living  0      SAB  0   IAB  0   Ectopic  0   Multiple  0   Live Births  0           Allergies  Allergen Reactions   Augmentin [Amoxicillin-Pot Clavulanate] Rash    Social History   Socioeconomic History   Marital status: Married    Spouse name: Not on file   Number of children: Not on file   Years of education: Not on file   Highest education level: Not on file  Occupational History   Not on file  Tobacco Use   Smoking status: Never   Smokeless tobacco: Never  Vaping Use   Vaping Use: Never used  Substance  and Sexual Activity   Alcohol use: No   Drug use: No   Sexual activity: Yes    Birth control/protection: Inserts  Other Topics Concern   Not on file  Social History Narrative   Not on file   Social Determinants of Health   Financial Resource Strain: Low Risk    Difficulty of Paying Living Expenses: Not hard at all  Food Insecurity: No Food Insecurity   Worried About Charity fundraiser in the Last Year: Never true   [place] in the Last Year: Never true  Transportation Needs: No Transportation Needs   Lack of Transportation (Medical): No   Lack of Transportation (Non-Medical): No  Physical Activity: Sufficiently Active   Days of Exercise per Week: 7 days   Minutes of Exercise per Session:  60 min  Stress: No Stress Concern Present   Feeling of Stress : Only a little  Social Connections: Moderately Isolated   Frequency of Communication with Friends and Family: More than three times a week   Frequency of Social Gatherings with Friends and Family: Twice a week   Attends Religious Services: Never   Marine scientist or Organizations: No   Attends Music therapist: Never   Marital Status: Living with partner    Family History  Problem Relation Age of Onset   Diabetes Paternal Grandfather    Diabetes Paternal Grandmother    Cancer Maternal Grandfather        lung    Medications:       Current Outpatient Medications:    etonogestrel-ethinyl estradiol (NUVARING) 0.12-0.015 MG/24HR vaginal ring, Place 1 each vaginally every 21 ( twenty-one) days. Insert one (1) ring vaginally and leave in place for three (3) weeks, then remove for one (1) week., Disp: 1 each, Rfl: 11   etonogestrel-ethinyl estradiol (NUVARING) 0.12-0.015 MG/24HR vaginal ring, Insert vaginally and leave in place for 3 consecutive weeks, then remove for 1 week., Disp: 3 each, Rfl: 4   valACYclovir (VALTREX) 500 MG tablet, Take 1 tablet (500 mg total) by mouth daily., Disp: 30 tablet, Rfl: 5   diclofenac Sodium (VOLTAREN) 1 % GEL, Apply 2 g topically 4 (four) times daily. For R foot pain (Patient not taking: Reported on 10/09/2020), Disp: 350 g, Rfl: 0   ondansetron (ZOFRAN) 8 MG tablet, TAKE 1 TABLET BY MOUTH EVERY 8 HOURS AS NEEDED FOR NAUSEA. (Patient not taking: Reported on 10/09/2020), Disp: 20 tablet, Rfl: 1   SUMAtriptan (IMITREX) 50 MG tablet, TAKE 1 TABLET BY MOUTH EVERY 2 HOURS AS NEEDED FOR MIGRAINE OR HEADACHE. MAY REPEAT IN 2 HOURS IF HEADACHE PERSISTS OR RECURS. MAX 2 TABLETS PER DAY. (Patient not taking: Reported on 10/09/2020), Disp: 10 tablet, Rfl: 1  Objective Blood pressure 135/88, pulse (!) 102, height 5\' 3"  (1.6 m), weight 136 lb (61.7 kg).  Gen WDWN NAD  Pertinent ROS No  burning with urination, frequency or urgency No nausea, vomiting or diarrhea Nor fever chills or other constitutional symptoms   Labs or studies     Impression Diagnoses this Encounter::   ICD-10-CM   1. Intermenstrual spotting  N92.3       Established relevant diagnosis(es):   Plan/Recommendations: Meds ordered this encounter  Medications   etonogestrel-ethinyl estradiol (NUVARING) 0.12-0.015 MG/24HR vaginal ring    Sig: Insert vaginally and leave in place for 3 consecutive weeks, then remove for 1 week.    Dispense:  3 each    Refill:  4  Labs or Scans Ordered: No orders of the defined types were placed in this encounter.   Management:: No specific solutions, may need to go on traditional pill to stabilize endometrium, she is dys synchronous  Follow up Return if symptoms worsen or fail to improve.     All questions were answered.

## 2020-10-10 ENCOUNTER — Other Ambulatory Visit (HOSPITAL_COMMUNITY): Payer: Self-pay

## 2020-10-11 ENCOUNTER — Other Ambulatory Visit (HOSPITAL_COMMUNITY): Payer: Self-pay

## 2020-10-19 ENCOUNTER — Other Ambulatory Visit (HOSPITAL_COMMUNITY): Payer: Self-pay

## 2020-10-30 ENCOUNTER — Telehealth: Payer: Self-pay | Admitting: Family Medicine

## 2020-10-30 ENCOUNTER — Ambulatory Visit: Payer: 59 | Admitting: Family Medicine

## 2020-10-30 ENCOUNTER — Encounter: Payer: Self-pay | Admitting: Family Medicine

## 2020-10-30 ENCOUNTER — Other Ambulatory Visit: Payer: Self-pay

## 2020-10-30 VITALS — BP 120/82 | Temp 97.9°F | Wt 137.2 lb

## 2020-10-30 DIAGNOSIS — Z1159 Encounter for screening for other viral diseases: Secondary | ICD-10-CM

## 2020-10-30 DIAGNOSIS — Z79899 Other long term (current) drug therapy: Secondary | ICD-10-CM

## 2020-10-30 DIAGNOSIS — Z131 Encounter for screening for diabetes mellitus: Secondary | ICD-10-CM

## 2020-10-30 DIAGNOSIS — Z114 Encounter for screening for human immunodeficiency virus [HIV]: Secondary | ICD-10-CM

## 2020-10-30 DIAGNOSIS — R0789 Other chest pain: Secondary | ICD-10-CM

## 2020-10-30 DIAGNOSIS — Z13220 Encounter for screening for lipoid disorders: Secondary | ICD-10-CM

## 2020-10-30 MED ORDER — CHLORZOXAZONE 500 MG PO TABS: ORAL_TABLET | ORAL | 0 refills | Status: DC
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
## 2020-10-30 NOTE — Patient Instructions (Signed)
You may use the Aleve twice daily as necessary  Muscle relaxer is just for the evening only If it causes excessive drowsiness please let us know  Please send [place] an update in 10 to 14 days

## 2020-10-30 NOTE — Telephone Encounter (Signed)
For wellness I recommend lipid, liver, metabolic 7 I also recommend hepatitis C antibody and HIV antibody based upon CDC guidelines  Please order these have patient do these labs before the office visit in feel free to inform her of the above thank you

## 2020-10-30 NOTE — Telephone Encounter (Signed)
No labs done recently. Please advise. Thank you

## 2020-10-30 NOTE — Telephone Encounter (Signed)
Patient has physical 9/8 and needing labs done

## 2020-10-31 NOTE — Telephone Encounter (Signed)
Blood work ordered in Epic. Patient notified. 

## 2020-11-07 ENCOUNTER — Other Ambulatory Visit (HOSPITAL_COMMUNITY): Payer: Self-pay

## 2020-11-29 ENCOUNTER — Encounter: Payer: 59 | Admitting: Family Medicine

## 2020-12-05 ENCOUNTER — Other Ambulatory Visit (HOSPITAL_COMMUNITY): Payer: Self-pay

## 2020-12-10 ENCOUNTER — Telehealth: Payer: Self-pay | Admitting: Family Medicine

## 2020-12-10 NOTE — Telephone Encounter (Signed)
Patient notified lab work orders would still be good and call if any problems. Patient verbalized understanding.

## 2020-12-10 NOTE — Telephone Encounter (Signed)
See 12/10/20 Patient Call

## 2020-12-10 NOTE — Telephone Encounter (Signed)
Corren returned Autumn's call 2:30, 9/19  CB# [phone]

## 2020-12-10 NOTE — Telephone Encounter (Signed)
Patient had to reschedule her 9/23 physical appointment to 10/14. Patient asked if her labs would still be valid for her new appointment.   CB# [phone]

## 2020-12-10 NOTE — Telephone Encounter (Signed)
Left message to return call 

## 2020-12-14 ENCOUNTER — Encounter: Payer: 59 | Admitting: Nurse Practitioner

## 2020-12-31 ENCOUNTER — Other Ambulatory Visit (HOSPITAL_COMMUNITY): Payer: Self-pay

## 2020-12-31 DIAGNOSIS — Z114 Encounter for screening for human immunodeficiency virus [HIV]: Secondary | ICD-10-CM | POA: Diagnosis not present

## 2020-12-31 DIAGNOSIS — Z79899 Other long term (current) drug therapy: Secondary | ICD-10-CM | POA: Diagnosis not present

## 2020-12-31 DIAGNOSIS — Z1159 Encounter for screening for other viral diseases: Secondary | ICD-10-CM | POA: Diagnosis not present

## 2020-12-31 DIAGNOSIS — Z131 Encounter for screening for diabetes mellitus: Secondary | ICD-10-CM | POA: Diagnosis not present

## 2020-12-31 DIAGNOSIS — Z13220 Encounter for screening for lipoid disorders: Secondary | ICD-10-CM | POA: Diagnosis not present

## 2021-01-01 ENCOUNTER — Encounter: Payer: Self-pay | Admitting: Family Medicine

## 2021-01-01 DIAGNOSIS — Z131 Encounter for screening for diabetes mellitus: Secondary | ICD-10-CM

## 2021-01-01 LAB — LIPID PANEL
Chol/HDL Ratio: 3.7 ratio (ref 0.0–4.4)
Cholesterol, Total: 255 mg/dL — ABNORMAL HIGH (ref 100–199)
HDL: 69 mg/dL (ref >39)
LDL Chol Calc (NIH): 164 mg/dL — ABNORMAL HIGH (ref 0–99)
Triglycerides: 123 mg/dL (ref 0–149)
VLDL Cholesterol Cal: 22 mg/dL (ref 5–40)

## 2021-01-01 LAB — BASIC METABOLIC PANEL
BUN/Creatinine Ratio: 9 (ref 9–23)
BUN: 8 mg/dL (ref 6–20)
CO2: 19 mmol/L — ABNORMAL LOW (ref 20–29)
Calcium: 9.8 mg/dL (ref 8.7–10.2)
Chloride: 102 mmol/L (ref 96–106)
Creatinine, Ser: 0.91 mg/dL (ref 0.57–1.00)
Glucose: 100 mg/dL — ABNORMAL HIGH (ref 70–99)
Potassium: 4.7 mmol/L (ref 3.5–5.2)
Sodium: 139 mmol/L (ref 134–144)
eGFR: 89 mL/min/{1.73_m2} (ref >59)

## 2021-01-01 LAB — HEPATIC FUNCTION PANEL
ALT: 9 IU/L (ref 0–32)
AST: 20 IU/L (ref 0–40)
Albumin: 4.7 g/dL (ref 3.9–5.0)
Alkaline Phosphatase: 69 IU/L (ref 44–121)
Bilirubin Total: 0.5 mg/dL (ref 0.0–1.2)
Bilirubin, Direct: <0.1 mg/dL (ref 0.00–0.40)
Total Protein: 7.8 g/dL (ref 6.0–8.5)

## 2021-01-01 LAB — HEPATITIS C ANTIBODY: Hep C Virus Ab: <0.1 s/co ratio (ref 0.0–0.9)

## 2021-01-01 LAB — HIV ANTIBODY (ROUTINE TESTING W REFLEX): HIV Screen 4th Generation wRfx: NONREACTIVE

## 2021-01-01 NOTE — Addendum Note (Signed)
Addended by: Vicente Males on: 01/01/2021 10:32 AM   Modules accepted: Orders

## 2021-01-01 NOTE — Telephone Encounter (Signed)
Nurses if possible please see if CBC can be added to what was drawn.  It is possible though that this may be a different tube necessary.  If so if she is interested in doing CBC she would have to get additional blood drawn.  Thanks

## 2021-01-02 DIAGNOSIS — Z131 Encounter for screening for diabetes mellitus: Secondary | ICD-10-CM | POA: Diagnosis not present

## 2021-01-03 LAB — CBC WITH DIFFERENTIAL/PLATELET
Basophils Absolute: 0.1 10*3/uL (ref 0.0–0.2)
Basos: 1 %
EOS (ABSOLUTE): 0.2 10*3/uL (ref 0.0–0.4)
Eos: 3 %
Hematocrit: 38 % (ref 34.0–46.6)
Hemoglobin: 12.6 g/dL (ref 11.1–15.9)
Immature Grans (Abs): 0 10*3/uL (ref 0.0–0.1)
Immature Granulocytes: 0 %
Lymphocytes Absolute: 2.8 10*3/uL (ref 0.7–3.1)
Lymphs: 39 %
MCH: 28.8 pg (ref 26.6–33.0)
MCHC: 33.2 g/dL (ref 31.5–35.7)
MCV: 87 fL (ref 79–97)
Monocytes Absolute: 0.6 10*3/uL (ref 0.1–0.9)
Monocytes: 8 %
Neutrophils Absolute: 3.5 10*3/uL (ref 1.4–7.0)
Neutrophils: 49 %
Platelets: 365 10*3/uL (ref 150–450)
RBC: 4.37 x10E6/uL (ref 3.77–5.28)
RDW: 10.9 % — ABNORMAL LOW (ref 11.7–15.4)
WBC: 7.2 10*3/uL (ref 3.4–10.8)

## 2021-01-04 ENCOUNTER — Ambulatory Visit (INDEPENDENT_AMBULATORY_CARE_PROVIDER_SITE_OTHER): Payer: 59 | Admitting: Nurse Practitioner

## 2021-01-04 ENCOUNTER — Encounter: Payer: Self-pay | Admitting: Nurse Practitioner

## 2021-01-04 ENCOUNTER — Other Ambulatory Visit: Payer: Self-pay

## 2021-01-04 VITALS — BP 100/63 | HR 93 | Temp 97.9°F | Ht 63.0 in | Wt 139.0 lb

## 2021-01-04 DIAGNOSIS — Z0000 Encounter for general adult medical examination without abnormal findings: Secondary | ICD-10-CM

## 2021-01-04 NOTE — Progress Notes (Signed)
   Subjective:    Patient ID: Veronica Allen, female    DOB: Jun 21, 1995, 25 y.o.   MRN: [id number]  HPI  The patient comes in today for a wellness visit.    A review of their health history was completed.  A review of medications was also completed.  Any needed refills; no  Eating habits: healthy   Falls/  MVA accidents in past few months: no  Regular exercise: walking 20 to 30 mins day  Specialist pt sees on regular basis: no  Preventative health issues were discussed.   Additional concerns: none  Review of Systems     Objective:   Physical Exam        Assessment & Plan:

## 2021-01-04 NOTE — Progress Notes (Signed)
Subjective:    Patient ID: Veronica Allen, female    DOB: 05-16-1995, 25 y.o.   MRN: [id number]  HPI Patient presents for physical today for work. 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 Does not eat that well or make the best choices but will make an effort to make better choices as her recent lipid profile was elevated. Sexually active with husband. On nuvaring and managed by gynecology. Gets pelvic exams and breast exams with GYN. Does not smoke or drink alcohol. Regular dental exams.   Review of Systems  Constitutional:  Negative for chills, fatigue and fever.  HENT:  Negative for sore throat and trouble swallowing.   Eyes:  Negative for visual disturbance.  Respiratory:  Negative for cough, chest tightness, shortness of breath and wheezing.   Cardiovascular:  Negative for chest pain.  Gastrointestinal:  Negative for abdominal pain, blood in stool, constipation, diarrhea, nausea and vomiting.  Genitourinary:  Negative for difficulty urinating, dysuria, frequency, pelvic pain and urgency.  Neurological:  Positive for headaches. Negative for dizziness and light-headedness.       Has migraines but managed with meds.       Objective:   Physical Exam Vitals reviewed.  Constitutional:      General: She is not in acute distress.    Appearance: Normal appearance.  Neck:     Comments: Thyroid non-tender, no masses or nodules noted. No enlargement or goiter noted. Cardiovascular:     Rate and Rhythm: Normal rate and regular rhythm.     Heart sounds: Normal heart sounds. No murmur heard. Pulmonary:     Effort: Pulmonary effort is normal.     Breath sounds: Normal breath sounds.  Abdominal:     General: There is no distension.     Palpations: Abdomen is soft.     Tenderness: There is no abdominal tenderness. There is no guarding or rebound.  Musculoskeletal:     Cervical back: Neck supple.   Lymphadenopathy:     Cervical: No cervical adenopathy.  Skin:    General: Skin is warm and dry.  Neurological:     Mental Status: She is alert and oriented to person, place, and time.  Psychiatric:        Mood and Affect: Mood normal.        Behavior: Behavior normal.        Thought Content: Thought content normal.        Judgment: Judgment normal.     . Vitals:   01/04/21 0954  BP: 100/63  Pulse: 93  Temp: 97.9 F (36.6 C)  Height: 5' 3"  (1.6 m)  Weight: 139 lb (63 kg)  SpO2: 97%  BMI (Calculated): 24.63   Recent Results (from the past 2160 hour(s))  Lipid panel     Status: Abnormal   Collection Time: 12/31/20  8:05 AM  Result Value Ref Range   Cholesterol, Total 255 (H) 100 - 199 mg/dL   Triglycerides 123 0 - 149 mg/dL   HDL 69 >39 mg/dL   VLDL Cholesterol Cal 22 5 - 40 mg/dL   LDL Chol Calc (NIH) 164 (H) 0 - 99 mg/dL   Chol/HDL Ratio 3.7 0.0 - 4.4 ratio    Comment:  T. Chol/HDL Ratio                                             Men  Women                               1/2 Avg.Risk  3.4    3.3                                   Avg.Risk  5.0    4.4                                2X Avg.Risk  9.6    7.1                                3X Avg.Risk 23.4   11.0   Hepatic function panel     Status: None   Collection Time: 12/31/20  8:05 AM  Result Value Ref Range   Total Protein 7.8 6.0 - 8.5 g/dL   Albumin 4.7 3.9 - 5.0 g/dL   Bilirubin Total 0.5 0.0 - 1.2 mg/dL   Bilirubin, Direct <0.10 0.00 - 0.40 mg/dL   Alkaline Phosphatase 69 44 - 121 IU/L   AST 20 0 - 40 IU/L   ALT 9 0 - 32 IU/L  Basic metabolic panel     Status: Abnormal   Collection Time: 12/31/20  8:05 AM  Result Value Ref Range   Glucose 100 (H) 70 - 99 mg/dL    Comment:               **Please note reference interval change**   BUN 8 6 - 20 mg/dL   Creatinine, Ser 0.91 0.57 - 1.00 mg/dL   eGFR 89 >59 mL/min/1.73   BUN/Creatinine Ratio 9 9 - 23   Sodium 139 134 - 144  mmol/L   Potassium 4.7 3.5 - 5.2 mmol/L   Chloride 102 96 - 106 mmol/L   CO2 19 (L) 20 - 29 mmol/L   Calcium 9.8 8.7 - 10.2 mg/dL  Hepatitis C Antibody     Status: None   Collection Time: 12/31/20  8:05 AM  Result Value Ref Range   Hep C Virus Ab <0.1 0.0 - 0.9 s/co ratio    Comment:                                   Negative:     < 0.8                              Indeterminate: 0.8 - 0.9                                   Positive:     > 0.9  HCV antibody alone does not differentiate between  previous resolved infection and active infection.  The CDC and current clinical guidelines recommend  that a positive HCV antibody  result be followed up  with an HCV RNA test to support the diagnosis of  acute HCV infection. [facility] offers Hepatitis C  Virus (HCV) RNA, Diagnosis, NAA (409811) and  Hepatitis C Virus (HCV) Antibody with reflex to  Quantitative Real-time PCR (144050).   HIV Antibody (routine testing w rflx)     Status: None   Collection Time: 12/31/20  8:05 AM  Result Value Ref Range   HIV Screen 4th Generation wRfx Non Reactive Non Reactive    Comment: HIV Negative HIV-1/HIV-2 antibodies and HIV-1 p24 antigen were NOT detected. There is no laboratory evidence of HIV infection.   CBC with Differential     Status: Abnormal   Collection Time: 01/02/21  9:07 AM  Result Value Ref Range   WBC 7.2 3.4 - 10.8 x10E3/uL   RBC 4.37 3.77 - 5.28 x10E6/uL   Hemoglobin 12.6 11.1 - 15.9 g/dL   Hematocrit 38.0 34.0 - 46.6 %   MCV 87 79 - 97 fL   MCH 28.8 26.6 - 33.0 pg   MCHC 33.2 31.5 - 35.7 g/dL   RDW 10.9 (L) 11.7 - 15.4 %   Platelets 365 150 - 450 x10E3/uL   Neutrophils 49 Not Estab. %   Lymphs 39 Not Estab. %   Monocytes 8 Not Estab. %   Eos 3 Not Estab. %   Basos 1 Not Estab. %   Neutrophils Absolute 3.5 1.4 - 7.0 x10E3/uL   Lymphocytes Absolute 2.8 0.7 - 3.1 x10E3/uL   Monocytes Absolute 0.6 0.1 - 0.9 x10E3/uL   EOS (ABSOLUTE) 0.2 0.0 - 0.4 x10E3/uL   Basophils Absolute  0.1 0.0 - 0.2 x10E3/uL   Immature Granulocytes 0 Not Estab. %   Immature Grans (Abs) 0.0 0.0 - 0.1 x10E3/uL   Reviewed labs with patient.       Assessment & Plan:  Routine general medical examination at a health care facility  Plan: Recommend diet low in saturated fats.  Discussed with patient healthier food options and mediterranean diet. Encouraged regular physical exercise 3-5 days a week.  Unclear if fasting sugar was truly fasting. Check sugars outside of the office and call if fasting is greater than 100. Recheck labs in one year.  Return in about 1 year (around 01/04/2022) for physical.

## 2021-01-21 ENCOUNTER — Other Ambulatory Visit (HOSPITAL_COMMUNITY): Payer: Self-pay

## 2021-01-21 ENCOUNTER — Telehealth: Payer: 59 | Admitting: Family Medicine

## 2021-01-21 DIAGNOSIS — J0140 Acute pansinusitis, unspecified: Secondary | ICD-10-CM

## 2021-01-21 MED ORDER — DOXYCYCLINE HYCLATE 100 MG PO TABS
100.0000 mg | ORAL_TABLET | Freq: Two times a day (BID) | ORAL | 0 refills | Status: AC
  Filled 2021-01-21: qty 20, 10d supply, fill #0

## 2021-01-21 NOTE — Progress Notes (Signed)

## 2021-01-28 ENCOUNTER — Other Ambulatory Visit (HOSPITAL_COMMUNITY): Payer: Self-pay

## 2021-02-18 ENCOUNTER — Encounter: Payer: Self-pay | Admitting: Family Medicine

## 2021-02-18 ENCOUNTER — Other Ambulatory Visit: Payer: Self-pay | Admitting: Family Medicine

## 2021-02-18 MED ORDER — VALACYCLOVIR HCL 500 MG PO TABS
500.0000 mg | ORAL_TABLET | Freq: Every day | ORAL | 3 refills | Status: DC
  Filled 2021-02-18: qty 90, 90d supply, fill #0
  Filled 2021-05-06: qty 30, 30d supply, fill #1

## 2021-02-19 ENCOUNTER — Other Ambulatory Visit (HOSPITAL_COMMUNITY): Payer: Self-pay

## 2021-02-22 ENCOUNTER — Other Ambulatory Visit (HOSPITAL_COMMUNITY): Payer: Self-pay

## 2021-03-29 ENCOUNTER — Ambulatory Visit: Payer: 59 | Admitting: Allergy & Immunology

## 2021-03-29 ENCOUNTER — Encounter: Payer: Self-pay | Admitting: Allergy & Immunology

## 2021-03-29 ENCOUNTER — Other Ambulatory Visit: Payer: Self-pay

## 2021-03-29 VITALS — BP 116/82 | HR 98 | Temp 99.4°F | Resp 18 | Ht 63.0 in | Wt 143.0 lb

## 2021-03-29 DIAGNOSIS — R053 Chronic cough: Secondary | ICD-10-CM

## 2021-03-29 DIAGNOSIS — R21 Rash and other nonspecific skin eruption: Secondary | ICD-10-CM | POA: Diagnosis not present

## 2021-03-29 DIAGNOSIS — J3089 Other allergic rhinitis: Secondary | ICD-10-CM | POA: Diagnosis not present

## 2021-03-29 DIAGNOSIS — J302 Other seasonal allergic rhinitis: Secondary | ICD-10-CM

## 2021-03-29 MED ORDER — CARBINOXAMINE MALEATE 4 MG PO TABS: 4.0000 mg | ORAL_TABLET | Freq: Three times a day (TID) | ORAL | 3 refills | Status: DC | PRN

## 2021-03-29 MED ORDER — IPRATROPIUM BROMIDE 0.03 % NA SOLN: 2.0000 | Freq: Three times a day (TID) | NASAL | 5 refills | Status: DC

## 2021-03-29 NOTE — Patient Instructions (Addendum)
1. Seasonal and perennial allergic rhinitis - Testing today showed: grasses, ragweed, weeds, trees, indoor molds, outdoor molds, dust mites, cockroach, and mouse and mixed feather . - Copy of test results provided.  - Avoidance measures provided. - Stop taking: Xyzal (for now) and the Flonase (for now) - Start taking: carbinoxamine 4mg  tablet 3-4 times daily as needed (can cause sleepiness and over drying) and ipratropium 1 spray per nostril 3 times daily as needed - You can use an extra dose of the antihistamine, if needed, for breakthrough symptoms.  - Consider nasal saline rinses 1-2 times daily to remove allergens from the nasal cavities as well as help with mucous clearance (this is especially helpful to do before the nasal sprays are given) - Consider allergy shots as a means of long-term control. - Allergy shots "re-train" and "reset" the immune system to ignore environmental allergens and decrease the resulting immune response to those allergens (sneezing, itchy watery eyes, runny nose, nasal congestion, etc).    - Allergy shots improve symptoms in 75-85% of patients.  - We can discuss more at the next appointment if the medications are not working for you.  2. Chronic cough - Lung testing looked phenomenal today. - We are not going to start an inhaler right now. - Hopefully once we manage the postnasal drip, the cough will improve. - Samples of Delsum provided to help with controlling the cough at night and see good sleep (can use until the other medicine start working).  3. Return in about 4 weeks (around 04/26/2021).    Please inform us of any Emergency Department visits, hospitalizations, or changes in symptoms. Call us before going to the ED for breathing or allergy symptoms since we might be able to fit you in for a sick visit. Feel free to contact us anytime with any questions, problems, or concerns.  It was a pleasure to meet you and your family today!  Websites that have  reliable patient information: 1. American Academy of Asthma, Allergy, and Immunology: www.aaaai.org 2. Food Allergy Research and Education (FARE): foodallergy.org 3. Mothers of Asthmatics: http://www.asthmacommunitynetwork.org 4. American College of Allergy, Asthma, and Immunology: www.acaai.org   COVID-19 Vaccine Information can be found at: [email or web address] For questions related to vaccine distribution or appointments, please email [email or web address] or call [phone].   We realize that you might be concerned about having an allergic reaction to the COVID19 vaccines. To help with that concern, WE ARE OFFERING THE COVID19 VACCINES IN OUR OFFICE! Ask the front desk for dates!     Like [place] on [employer] and Instagram for our latest updates!      A healthy democracy works best when [employer] participate! Make sure you are registered to vote! If you have moved or changed any of your contact information, you will need to get this updated before voting!  In some cases, you MAY be able to register to vote online: [email or web address]       Airborne Adult Perc - 03/29/21 0943     Time Antigen Placed 5621    Allergen Manufacturer Lavella Hammock    Location Back    Number of Test 59    1. Control-Buffer 50% Glycerol Negative    2. Control-Histamine 1 mg/ml 2+    3. Albumin saline Negative    4. [place] Negative    5. [place] Negative    6. Johnson Negative    7. [place] Blue 2+    8. Meadow Fescue 2+    9. Perennial Rye 2+  10. Sweet Vernal 2+    11. Timothy 2+    12. Cocklebur 2+    13. Burweed Marshelder Negative    14. Ragweed, short Negative    15. Ragweed, Giant Negative    16. Plantain,  English 2+    17. Lamb's Quarters 2+    18. Sheep Sorrell Negative    19. Rough Pigweed Negative    20. Marsh Elder, Rough 2+    21. Mugwort, Common Negative    22. Ash mix Negative    23. Birch  mix 2+    24. Beech American 2+    25. Box, Elder 2+    26. Cedar, red 2+    27. Cottonwood, [place] Negative    28. Elm mix 2+    29. Hickory 2+    30. Maple mix 2+    31. Oak, [place] mix 2+    32. Pecan Pollen 2+    33. Pine mix 2+    34. Sycamore Eastern Negative    35. [place], Black Pollen 2+    36. Alternaria alternata 2+    37. Cladosporium Herbarum 2+    38. Aspergillus mix 2+    39. Penicillium mix Negative    40. Bipolaris sorokiniana (Helminthosporium) 2+    41. Drechslera spicifera (Curvularia) 2+    42. Mucor plumbeus 2+    43. Fusarium moniliforme 2+    44. Aureobasidium pullulans (pullulara) 2+    45. Rhizopus oryzae Negative    46. Botrytis cinera Negative    47. Epicoccum nigrum Negative    48. Phoma betae Negative    49. Candida Albicans 2+    50. Trichophyton mentagrophytes Negative    51. Mite, D Farinae  5,000 AU/ml 2+    52. Mite, D Pteronyssinus  5,000 AU/ml 2+    53. Cat Hair 10,000 BAU/ml Negative    54.  Dog Epithelia Negative    55. Mixed Feathers 2+    56. Horse Epithelia Negative    57. Cockroach, German 2+    58. Mouse 2+    59. Tobacco Leaf Negative    Comments [place] with a left on 11 January.  At thatThat parents             Intradermal - 03/29/21 1013     Time Antigen Placed 1013    Allergen Manufacturer Lavella Hammock    Location Back    Number of Test 6    Control Negative    [place] 1+    Johnson Negative    Weed mix 1+   RAGWEED   Cat Negative    Dog Negative             Reducing Pollen Exposure  The American Academy of Allergy, Asthma and Immunology suggests the following steps to reduce your exposure to pollen during allergy seasons.    Do not hang sheets or clothing out to dry; pollen may collect on these items. Do not mow lawns or spend time around freshly cut grass; mowing stirs up pollen. Keep windows closed at night.  Keep car windows closed while driving. Minimize morning activities outdoors, a time when  pollen counts are usually at their highest. Stay indoors as much as possible when pollen counts or humidity is high and on windy days when pollen tends to remain in the air longer. Use air conditioning when possible.  Many air conditioners have filters that trap the pollen spores. Use a HEPA room air filter to remove pollen  form the indoor air you breathe.  Control of Mold Allergen   Mold and fungi can grow on a variety of surfaces provided certain temperature and moisture conditions exist.  Outdoor molds grow on plants, decaying vegetation and soil.  The major outdoor mold, Alternaria and Cladosporium, are found in very high numbers during hot and dry conditions.  Generally, a late Summer - Fall peak is seen for common outdoor fungal spores.  Rain will temporarily lower outdoor mold spore count, but counts rise rapidly when the rainy period ends.  The most important indoor molds are Aspergillus and Penicillium.  Dark, humid and poorly ventilated basements are ideal sites for mold growth.  The next most common sites of mold growth are the bathroom and the kitchen.  Outdoor (Seasonal) Mold Control  Positive outdoor molds via skin testing: Alternaria, Cladosporium, Bipolaris (Helminthsporium), Drechslera (Curvalaria), and Mucor  Use air conditioning and keep windows closed Avoid exposure to decaying vegetation. Avoid leaf raking. Avoid grain handling. Consider wearing a face mask if working in moldy areas.    Indoor (Perennial) Mold Control   Positive indoor molds via skin testing: Aspergillus, Fusarium, Aureobasidium (Pullulara), and Candida  Maintain humidity below 50%. Clean washable surfaces with 5% bleach solution. Remove sources e.g. contaminated carpets.    Control of Dust Mite Allergen    Dust mites play a major role in allergic asthma and rhinitis.  They occur in environments with high humidity wherever human skin is found.  Dust mites absorb humidity from the atmosphere (ie,  they do not drink) and feed on organic matter (including shed human and animal skin).  Dust mites are a microscopic type of insect that you cannot see with the naked eye.  High levels of dust mites have been detected from mattresses, pillows, carpets, upholstered furniture, bed covers, clothes, soft toys and any woven material.  The principal allergen of the dust mite is found in its feces.  A gram of dust may contain 1,000 mites and 250,000 fecal particles.  Mite antigen is easily measured in the air during house cleaning activities.  Dust mites do not bite and do not cause harm to humans, other than by triggering allergies/asthma.    Ways to decrease your exposure to dust mites in your home:  Encase mattresses, box springs and pillows with a mite-impermeable barrier or cover   Wash sheets, blankets and drapes weekly in hot water (130 F) with detergent and dry them in a dryer on the hot setting.  Have the room cleaned frequently with a vacuum cleaner and a damp dust-mop.  For carpeting or rugs, vacuuming with a vacuum cleaner equipped with a high-efficiency particulate air (HEPA) filter.  The dust mite allergic individual should not be in a room which is being cleaned and should wait 1 hour after cleaning before going into the room. Do not sleep on upholstered furniture (eg, couches).   If possible removing carpeting, upholstered furniture and drapery from the home is ideal.  Horizontal blinds should be eliminated in the rooms where the person spends the most time (bedroom, study, television room).  Washable vinyl, roller-type shades are optimal. Remove all non-washable stuffed toys from the bedroom.  Wash stuffed toys weekly like sheets and blankets above.   Reduce indoor humidity to less than 50%.  Inexpensive humidity monitors can be purchased at most hardware stores.  Do not use a humidifier as can make the problem worse and are not recommended.  Control of Cockroach Allergen  Cockroach allergen  has been identified as an important cause of acute attacks of asthma, especially in urban settings.  There are fifty-five species of cockroach that exist in the [place], however only three, the [place], [place] species produce allergen that can affect patients with Asthma.  Allergens can be obtained from fecal particles, egg casings and secretions from cockroaches.    Remove food sources. Reduce access to water. Seal access and entry points. Spray runways with 0.5-1% Diazinon or Chlorpyrifos Blow boric acid power under stoves and refrigerator. Place bait stations (hydramethylnon) at feeding sites.  Allergy Shots   Allergies are the result of a chain reaction that starts in the immune system. Your immune system controls how your body defends itself. For instance, if you have an allergy to pollen, your immune system identifies pollen as an invader or allergen. Your immune system overreacts by producing antibodies called Immunoglobulin E (IgE). These antibodies travel to cells that release chemicals, causing an allergic reaction.  The concept behind allergy immunotherapy, whether it is received in the form of shots or tablets, is that the immune system can be desensitized to specific allergens that trigger allergy symptoms. Although it requires time and patience, the payback can be long-term relief.  How Do Allergy Shots Work?  Allergy shots work much like a vaccine. Your body responds to injected amounts of a particular allergen given in increasing doses, eventually developing a resistance and tolerance to it. Allergy shots can lead to decreased, minimal or no allergy symptoms.  There generally are two phases: build-up and maintenance. Build-up often ranges from three to six months and involves receiving injections with increasing amounts of the allergens. The shots are typically given once or twice a week, though more rapid build-up schedules are sometimes used.  The  maintenance phase begins when the most effective dose is reached. This dose is different for each person, depending on how allergic you are and your response to the build-up injections. Once the maintenance dose is reached, there are longer periods between injections, typically two to four weeks.  Occasionally doctors give cortisone-type shots that can temporarily reduce allergy symptoms. These types of shots are different and should not be confused with allergy immunotherapy shots.  Who Can Be Treated with Allergy Shots?  Allergy shots may be a good treatment approach for people with allergic rhinitis (hay fever), allergic asthma, conjunctivitis (eye allergy) or stinging insect allergy.   Before deciding to begin allergy shots, you should consider:   The length of allergy season and the severity of your symptoms  Whether medications and/or changes to your environment can control your symptoms  Your desire to avoid long-term medication use  Time: allergy immunotherapy requires a major time commitment  Cost: may vary depending on your insurance coverage  Allergy shots for children age 35 and older are effective and often well tolerated. They might prevent the onset of new allergen sensitivities or the progression to asthma.  Allergy shots are not started on patients who are pregnant but can be continued on patients who become pregnant while receiving them. In some patients with other medical conditions or who take certain common medications, allergy shots may be of risk. It is important to mention other medications you talk to your allergist.   When Will I Feel Better?  Some may experience decreased allergy symptoms during the build-up phase. For others, it may take as long as 12 months on the maintenance dose. If there is no improvement after a year of maintenance,  your allergist will discuss other treatment options with you.  If you arent responding to allergy shots, it may be because  there is not enough dose of the allergen in your vaccine or there are missing allergens that were not identified during your allergy testing. Other reasons could be that there are high levels of the allergen in your environment or major exposure to non-allergic triggers like tobacco smoke.  What Is the Length of Treatment?  Once the maintenance dose is reached, allergy shots are generally continued for three to five years. The decision to stop should be discussed with your allergist at that time. Some people may experience a permanent reduction of allergy symptoms. Others may relapse and a longer course of allergy shots can be considered.  What Are the Possible Reactions?  The two types of adverse reactions that can occur with allergy shots are local and systemic. Common local reactions include very mild redness and swelling at the injection site, which can happen immediately or several hours after. A systemic reaction, which is less common, affects the entire body or a particular body system. They are usually mild and typically respond quickly to medications. Signs include increased allergy symptoms such as sneezing, a stuffy nose or hives.  Rarely, a serious systemic reaction called anaphylaxis can develop. Symptoms include swelling in the throat, wheezing, a feeling of tightness in the chest, nausea or dizziness. Most serious systemic reactions develop within 30 minutes of allergy shots. This is why it is strongly recommended you wait in your doctors office for 30 minutes after your injections. Your allergist is trained to watch for reactions, and his or her staff is trained and equipped with the proper medications to identify and treat them.  Who Should Administer Allergy Shots?  The preferred location for receiving shots is your prescribing allergists office. Injections can sometimes be given at another facility where the physician and staff are trained to recognize and treat reactions, and have  received instructions by your prescribing allergist.

## 2021-03-29 NOTE — Progress Notes (Signed)
NEW PATIENT  Date of Service/Encounter:  03/29/21  Consult requested by: Kathyrn Drown, MD   Assessment:   Seasonal and perennial allergic rhinitis (grasses, ragweed, weeds, trees, indoor molds, outdoor molds, dust mites, cockroach, and mouse and mixed feather)  Chronic cough - with normal spirometry  Plan/Recommendations:    1. Seasonal and perennial allergic rhinitis - Testing today showed: grasses, ragweed, weeds, trees, indoor molds, outdoor molds, dust mites, cockroach, and mouse and mixed feather  - Copy of test results provided.  - Avoidance measures provided. - Stop taking: Xyzal (for now) and the Flonase (for now) - Start taking: carbinoxamine 4mg  tablet 3-4 times daily as needed (can cause sleepiness and over drying) and ipratropium 1 spray per nostril 3 times daily as needed - You can use an extra dose of the antihistamine, if needed, for breakthrough symptoms.  - Consider nasal saline rinses 1-2 times daily to remove allergens from the nasal cavities as well as help with mucous clearance (this is especially helpful to do before the nasal sprays are given) - Consider allergy shots as a means of long-term control. - Allergy shots "re-train" and "reset" the immune system to ignore environmental allergens and decrease the resulting immune response to those allergens (sneezing, itchy watery eyes, runny nose, nasal congestion, etc).    - Allergy shots improve symptoms in 75-85% of patients.  - We can discuss more at the next appointment if the medications are not working for you.  2. Chronic cough - Lung testing looked phenomenal today. - We are not going to start an inhaler right now. - Hopefully once we manage the postnasal drip, the cough will improve. - Samples of Delsum provided to help with controlling the cough at night and see good sleep (can use until the other medicine start working).  3. Return in about 4 weeks (around 04/26/2021).     This note in its  entirety was forwarded to the Provider who requested this consultation.  Subjective:   Veronica Allen is a 26 y.o. female presenting today for evaluation of  Chief Complaint  Patient presents with   Allergic Rhinitis     Says she hasnt always had allergies. Says she has noticed in the last two years she noticed she has developed allergies. Says she had had to use various antihistamines consistently over the last six  months. Symptoms include red areas on her abdomen, dry cough, itchy water eyes, runny and itchy nose, sneezing daily as well.    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  Veronica Allen was referred by Kathyrn Drown, MD.     Veronica Allen is a 26 y.o. female presenting for an evaluation of environmental allergies .  She did not feel that she had allergies as achild. For the last two years she has needed to take something every day. She took an Runner, broadcasting/film/video month and then she landed on Xyzal two months ago and this seems to be best. She still has rhinorrhea and a dry cough. She periodically does Flonase but she never really felt that it helped. She will take Allegra nad Flonase in the morning and Xyzal and Benadryl at night. And this doesn ot clear it up completely.   There is not a time of the year th at is worse for  her. Outside is definitely worse. She does have some animals at home including a golden doodle, Restaurant manager, fast food and a black lab. There are no cats in the home. She works at [employer]. She seems to have more rhinorrhea with the mask being on.   She has had a chronic cough for 6 months. She lost her voice two months ago and got prednisone. This did not resolve the cough completely.   She had a rash in June 2021 (see below). This was treated with hydrocortisone and  permethrin. She also took some Benadryl. She has not had this come back since that summer.      She also has a rash on her abdomen that popped up since she stopped her antihistamines. This has been over the last few days. It is not itchy at all. She is unsure about the etiology of this. No one else in the family has had this rash.       She has not noticed a food allergy. She is lactose intolerant. She otherwise tolerates all of the major food allergens without adverse event.   Otherwise, there is no history of other atopic diseases, including asthma, food allergies, drug allergies, stinging insect allergies, eczema, or contact dermatitis. There is no significant infectious history. Vaccinations are up to date.    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 If you have any questions, ask your nurse or doctor.          Carbinoxamine Maleate 4 MG Tabs Take 1 tablet (4 mg total) by mouth every 8 (eight) hours as needed. Started by: Valentina Shaggy, MD   etonogestrel-ethinyl estradiol 0.12-0.015 MG/24HR vaginal ring Commonly known as: [place] 1 each vaginally every 21 ( twenty-one) days. Insert one (1) ring vaginally and leave in place for three (3) weeks, then remove for one (1) week.   fexofenadine 180 MG tablet Commonly known as: ALLEGRA Take 180 mg by mouth daily.   ipratropium 0.03 % nasal spray Commonly known as: ATROVENT Place 2 sprays into both nostrils 3 (three) times daily. Started by: Valentina Shaggy, MD   levocetirizine 5 MG tablet Commonly known as: XYZAL Take 5 mg by mouth every evening.   SUMAtriptan 50 MG tablet Commonly known as: IMITREX TAKE 1 TABLET BY MOUTH EVERY 2 HOURS AS  NEEDED FOR MIGRAINE OR HEADACHE. MAY REPEAT IN 2 HOURS IF HEADACHE PERSISTS OR RECURS. MAX 2 TABLETS PER DAY.   valACYclovir 500 MG tablet Commonly known as: VALTREX Take 1 tablet (500 mg total) by mouth daily.        Birth History: non-contributory  Developmental History: non-contributory  Past Surgical History: Past Surgical History:  Procedure Laterality Date   WISDOM TOOTH EXTRACTION  06/2015     Family History: Family History  Problem Relation Age of Onset   Asthma Mother    Asthma Sister    Allergic rhinitis Sister    Eczema Sister    Cancer Maternal Grandfather        lung   Diabetes Paternal Grandmother    Diabetes Paternal Grandfather      Social History: Veronica Allen lives  at home with her husband in a house that was built in 1942.  There is laminate in the main living areas and carpeting in the bedrooms.  They have gas heating and air heat pump for heating and cooling.  They have 3 dogs inside of the home.  There are no dust mite covers on the bedding.  There is no known tobacco exposure.  She currently works as a Equities trader.  They do not use a HEPA filter.  They do not live near an interstate or industrial area.   Review of Systems  Constitutional: Negative.  Negative for fever, malaise/fatigue and weight loss.  HENT:  Positive for congestion. Negative for ear discharge and ear pain.   Eyes:  Positive for discharge and redness. Negative for pain.  Respiratory:  Positive for cough. Negative for hemoptysis, sputum production, shortness of breath and wheezing.   Cardiovascular: Negative.  Negative for chest pain and palpitations.  Gastrointestinal:  Negative for abdominal pain, constipation, diarrhea, heartburn, nausea and vomiting.  Skin: Negative.  Negative for itching and rash.  Neurological:  Negative for dizziness and headaches.  Endo/Heme/Allergies:  Positive for environmental allergies. Does not bruise/bleed easily.      Objective:   Blood pressure  116/82, pulse 98, temperature 99.4 F (37.4 C), temperature source Temporal, resp. rate 18, height 5\' 3"  (1.6 m), weight 143 lb (64.9 kg), SpO2 100 %. Body mass index is 25.33 kg/m.   Physical Exam:   Physical Exam Constitutional:      Appearance: She is well-developed.     Comments: Somewhat anxious at times.  HENT:     Head: Normocephalic and atraumatic.     Right Ear: Tympanic membrane, ear canal and external ear normal. No drainage, swelling or tenderness. Tympanic membrane is not injected, scarred, erythematous, retracted or bulging.     Left Ear: Tympanic membrane, ear canal and external ear normal. No drainage, swelling or tenderness. Tympanic membrane is not injected, scarred, erythematous, retracted or bulging.     Nose: No nasal deformity, septal deviation, mucosal edema or rhinorrhea.     Right Turbinates: Enlarged, swollen and pale.     Left Turbinates: Enlarged, swollen and pale.     Right Sinus: No maxillary sinus tenderness or frontal sinus tenderness.     Left Sinus: No maxillary sinus tenderness or frontal sinus tenderness.     Mouth/Throat:     Mouth: Mucous membranes are not pale and not dry.     Pharynx: Uvula midline.  Eyes:     General:        Right eye: No discharge.        Left eye: No discharge.     Conjunctiva/sclera: Conjunctivae normal.     Right eye: Right conjunctiva is not injected. No chemosis.    Left eye: Left conjunctiva is not injected. No chemosis.    Pupils: Pupils are equal, round, and reactive to light.  Cardiovascular:     Rate and Rhythm: Normal rate and regular rhythm.     Heart sounds: Normal heart sounds.  Pulmonary:     Effort: Pulmonary effort is normal. No tachypnea, accessory muscle usage or respiratory distress.     Breath sounds: Normal breath sounds. No wheezing, rhonchi or rales.     Comments: Moving air well in all lung fields.  No increased work of breathing. Chest:     Chest wall: No tenderness.  Abdominal:      Tenderness: There is no abdominal tenderness. There is no  guarding or rebound.  Lymphadenopathy:     Head:     Right side of head: No submandibular, tonsillar or occipital adenopathy.     Left side of head: No submandibular, tonsillar or occipital adenopathy.     Cervical: No cervical adenopathy.  Skin:    General: Skin is warm.     Capillary Refill: Capillary refill takes less than 2 seconds.     Coloration: Skin is not pale.     Findings: No abrasion, erythema, petechiae or rash. Rash is not papular, urticarial or vesicular.     Comments: Very sensitive skin.  Neurological:     Mental Status: She is alert.  Psychiatric:        Behavior: Behavior is cooperative.     Diagnostic studies:    Spirometry: results normal (FEV1: 2.78/88%, FVC: 3.54/96%, FEV1/FVC: 79%).    Spirometry consistent with normal pattern.   Allergy Studies:     Airborne Adult Perc - 03/29/21 0943     Time Antigen Placed 7035    Allergen Manufacturer Lavella Hammock    Location Back    Number of Test 59    1. Control-Buffer 50% Glycerol Negative    2. Control-Histamine 1 mg/ml 2+    3. Albumin saline Negative    4. Soledad Negative    5. [place] Negative    6. Johnson Negative    7. [place] Blue 2+    8. Meadow Fescue 2+    9. Perennial Rye 2+    10. Sweet Vernal 2+    11. Timothy 2+    12. Cocklebur 2+    13. Burweed Marshelder Negative    14. Ragweed, short Negative    15. Ragweed, Giant Negative    16. Plantain,  English 2+    17. Lamb's Quarters 2+    18. Sheep Sorrell Negative    19. Rough Pigweed Negative    20. Marsh Elder, Rough 2+    21. Mugwort, Common Negative    22. Ash mix Negative    23. Birch mix 2+    24. Beech American 2+    25. Box, Elder 2+    26. Cedar, red 2+    27. Cottonwood, [place] Negative    28. Elm mix 2+    29. Hickory 2+    30. Maple mix 2+    31. Oak, [place] mix 2+    32. Pecan Pollen 2+    33. Pine mix 2+    34. Sycamore Eastern Negative    35. Wilhoit, Black  Pollen 2+    36. Alternaria alternata 2+    37. Cladosporium Herbarum 2+    38. Aspergillus mix 2+    39. Penicillium mix Negative    40. Bipolaris sorokiniana (Helminthosporium) 2+    41. Drechslera spicifera (Curvularia) 2+    42. Mucor plumbeus 2+    43. Fusarium moniliforme 2+    44. Aureobasidium pullulans (pullulara) 2+    45. Rhizopus oryzae Negative    46. Botrytis cinera Negative    47. Epicoccum nigrum Negative    48. Phoma betae Negative    49. Candida Albicans 2+    50. Trichophyton mentagrophytes Negative    51. Mite, D Farinae  5,000 AU/ml 2+    52. Mite, D Pteronyssinus  5,000 AU/ml 2+    53. Cat Hair 10,000 BAU/ml Negative    54.  Dog Epithelia Negative    55. Mixed Feathers 2+    56. Horse Epithelia Negative  57. Cockroach, German 2+    58. Mouse 2+    59. Tobacco Leaf Negative    Comments [place] with a left on 11 January.  At thatThat parents             Intradermal - 03/29/21 1013     Time Antigen Placed 1013    Allergen Manufacturer Lavella Hammock    Location Back    Number of Test 6    Control Negative    [place] 1+    Johnson Negative    Weed mix 1+   RAGWEED   Cat Negative    Dog Negative             Allergy testing results were read and interpreted by myself, documented by clinical staff.         Salvatore Marvel, MD [facility]

## 2021-04-16 ENCOUNTER — Encounter: Payer: Self-pay | Admitting: Allergy & Immunology

## 2021-04-24 NOTE — Patient Instructions (Addendum)
1. Seasonal and perennial allergic rhinitis (grasses, ragweed, weeds, trees, indoor molds, outdoor molds, dust mites, cockroach, and mouse and mixed feather) - Continue taking: carbinoxamine 4mg  tablet at night as needed (can cause sleepiness and over drying)  - Wishes to start allergy injections. CPT codes given. Call our office after you call your insurance company to find out cost and if interested call our office to start allergy injections. - Stop ipratropium bromide nasal spray since it made you feel like you were walking on a water bed -Stop Sudafed -Start Flonase (fluticasone) nasal spay 2 sprays each nostril once a day as needed for stuffy nose. In the right nostril, point the applicator out toward the right ear. In the left nostril, point the applicator out toward the left ear  2. Chronic cough Start Armonair 113 mcg 1 puff twice a day as recommended by Dr. Ernst Bowler per his message. (Demonstration given) May use albuterol 2 puffs every 4-6 hours as needed for cough, wheeze, tightness in chest, or shortness of breath (Sample of ProAir Respiclick given) Get PA/lateral chest x-ray due to cough. We will call you with results once they are back  Please let us know if this treatment plan is not working well for you Schedule a follow-up appointment in 6 weeks or sooner if needed         Reducing Pollen Exposure  The American Academy of Allergy, Asthma and Immunology suggests the following steps to reduce your exposure to pollen during allergy seasons.    Do not hang sheets or clothing out to dry; pollen may collect on these items. Do not mow lawns or spend time around freshly cut grass; mowing stirs up pollen. Keep windows closed at night.  Keep car windows closed while driving. Minimize morning activities outdoors, a time when pollen counts are usually at their highest. Stay indoors as much as possible when pollen counts or humidity is high and on windy days when pollen tends to  remain in the air longer. Use air conditioning when possible.  Many air conditioners have filters that trap the pollen spores. Use a HEPA room air filter to remove pollen form the indoor air you breathe.  Control of Mold Allergen   Mold and fungi can grow on a variety of surfaces provided certain temperature and moisture conditions exist.  Outdoor molds grow on plants, decaying vegetation and soil.  The major outdoor mold, Alternaria and Cladosporium, are found in very high numbers during hot and dry conditions.  Generally, a late Summer - Fall peak is seen for common outdoor fungal spores.  Rain will temporarily lower outdoor mold spore count, but counts rise rapidly when the rainy period ends.  The most important indoor molds are Aspergillus and Penicillium.  Dark, humid and poorly ventilated basements are ideal sites for mold growth.  The next most common sites of mold growth are the bathroom and the kitchen.  Outdoor (Seasonal) Mold Control  Positive outdoor molds via skin testing: Alternaria, Cladosporium, Bipolaris (Helminthsporium), Drechslera (Curvalaria), and Mucor  Use air conditioning and keep windows closed Avoid exposure to decaying vegetation. Avoid leaf raking. Avoid grain handling. Consider wearing a face mask if working in moldy areas.    Indoor (Perennial) Mold Control   Positive indoor molds via skin testing: Aspergillus, Fusarium, Aureobasidium (Pullulara), and Candida  Maintain humidity below 50%. Clean washable surfaces with 5% bleach solution. Remove sources e.g. contaminated carpets.    Control of Dust Mite Allergen    Dust mites play a  major role in allergic asthma and rhinitis.  They occur in environments with high humidity wherever human skin is found.  Dust mites absorb humidity from the atmosphere (ie, they do not drink) and feed on organic matter (including shed human and animal skin).  Dust mites are a microscopic type of insect that you cannot see with  the naked eye.  High levels of dust mites have been detected from mattresses, pillows, carpets, upholstered furniture, bed covers, clothes, soft toys and any woven material.  The principal allergen of the dust mite is found in its feces.  A gram of dust may contain 1,000 mites and 250,000 fecal particles.  Mite antigen is easily measured in the air during house cleaning activities.  Dust mites do not bite and do not cause harm to humans, other than by triggering allergies/asthma.    Ways to decrease your exposure to dust mites in your home:  Encase mattresses, box springs and pillows with a mite-impermeable barrier or cover   Wash sheets, blankets and drapes weekly in hot water (130 F) with detergent and dry them in a dryer on the hot setting.  Have the room cleaned frequently with a vacuum cleaner and a damp dust-mop.  For carpeting or rugs, vacuuming with a vacuum cleaner equipped with a high-efficiency particulate air (HEPA) filter.  The dust mite allergic individual should not be in a room which is being cleaned and should wait 1 hour after cleaning before going into the room. Do not sleep on upholstered furniture (eg, couches).   If possible removing carpeting, upholstered furniture and drapery from the home is ideal.  Horizontal blinds should be eliminated in the rooms where the person spends the most time (bedroom, study, television room).  Washable vinyl, roller-type shades are optimal. Remove all non-washable stuffed toys from the bedroom.  Wash stuffed toys weekly like sheets and blankets above.   Reduce indoor humidity to less than 50%.  Inexpensive humidity monitors can be purchased at most hardware stores.  Do not use a humidifier as can make the problem worse and are not recommended.  Control of Cockroach Allergen  Cockroach allergen has been identified as an important cause of acute attacks of asthma, especially in urban settings.  There are fifty-five species of cockroach that exist in  the [place], however only three, the [place], [place] species produce allergen that can affect patients with Asthma.  Allergens can be obtained from fecal particles, egg casings and secretions from cockroaches.    Remove food sources. Reduce access to water. Seal access and entry points. Spray runways with 0.5-1% Diazinon or Chlorpyrifos Blow boric acid power under stoves and refrigerator. Place bait stations (hydramethylnon) at feeding sites.

## 2021-04-26 ENCOUNTER — Ambulatory Visit: Payer: 59 | Admitting: Family

## 2021-04-26 ENCOUNTER — Other Ambulatory Visit (HOSPITAL_COMMUNITY): Payer: Self-pay

## 2021-04-26 ENCOUNTER — Other Ambulatory Visit: Payer: Self-pay | Admitting: *Deleted

## 2021-04-26 ENCOUNTER — Encounter: Payer: Self-pay | Admitting: Family

## 2021-04-26 ENCOUNTER — Other Ambulatory Visit: Payer: Self-pay

## 2021-04-26 ENCOUNTER — Ambulatory Visit: Payer: 59 | Admitting: Allergy & Immunology

## 2021-04-26 VITALS — BP 112/80 | HR 90 | Temp 98.4°F | Resp 16 | Ht 63.0 in | Wt 145.4 lb

## 2021-04-26 DIAGNOSIS — J302 Other seasonal allergic rhinitis: Secondary | ICD-10-CM

## 2021-04-26 DIAGNOSIS — J3089 Other allergic rhinitis: Secondary | ICD-10-CM | POA: Diagnosis not present

## 2021-04-26 DIAGNOSIS — R053 Chronic cough: Secondary | ICD-10-CM | POA: Diagnosis not present

## 2021-04-26 MED ORDER — CARBINOXAMINE MALEATE 4 MG PO TABS: 4.0000 mg | ORAL_TABLET | Freq: Three times a day (TID) | ORAL | 5 refills | Status: DC | PRN

## 2021-04-26 MED ORDER — EPINEPHRINE 0.3 MG/0.3ML IJ SOAJ
0.3000 mg | INTRAMUSCULAR | 1 refills | Status: DC
  Filled 2021-04-26: qty 2, 14d supply, fill #0

## 2021-04-26 MED ORDER — FLUTICASONE PROPIONATE 50 MCG/ACT NA SUSP: 2.0000 | Freq: Every day | NASAL | 5 refills | Status: DC | PRN

## 2021-04-26 MED ORDER — PROAIR DIGIHALER 108 (90 BASE) MCG/ACT IN AEPB: 2.0000 | INHALATION_SPRAY | RESPIRATORY_TRACT | 1 refills | Status: DC | PRN

## 2021-04-26 MED ORDER — ARMONAIR DIGIHALER 113 MCG/ACT IN AEPB: 1.0000 | INHALATION_SPRAY | Freq: Two times a day (BID) | RESPIRATORY_TRACT | 5 refills | Status: DC

## 2021-04-26 NOTE — Progress Notes (Signed)
[place], [address] [place] [address] Dept: [phone]  FOLLOW UP NOTE  Patient ID: Veronica Allen, female    DOB: 25-Aug-1995  Age: 26 y.o. MRN: [id number] Date of Office Visit: 04/26/2021  Assessment  Chief Complaint: Cough (Says her cough is still there)  HPI Veronica Allen is a 26 year old female who presents today for follow-up of seasonal and perennial allergic rhinitis and chronic cough with normal spirometry.  She was last seen on March 29, 2021 by Dr. Ernst Bowler.  Since her last office visit she denies any new diagnoses or surgeries.  Seasonal and perennial allergic rhinitis is reported as not well controlled with carbinoxamine 4 mg at night and ipratropium bromide 1 spray each nostril twice a day.  She reports that ipratropium bromide makes her feel like she is walking on a water bed.  She can also only take carbinoxamine at night because it makes her drowsy.  She reports that the first week she took carbinoxamine at night that she felt drunk at work.  She now takes it earlier and no longer feels this way.  She does feel like since starting carbinoxamine that she has more good days than not.  She also recently has been taking Sudafed due to the nasal congestion.  She reports clear rhinorrhea and nasal congestion that is worse at night.  She denies postnasal drip and has not had any sinus infections since we last saw her.  She is interested in starting allergy injections because she likes to do outdoor activities.  She mentions in the past that when she used Flonase did not help, but she just did not use it consistently.  Chronic cough is reported as still present.  The cough is reported as dry.  She mentions that when she coughs it is not just 1 cough it is a coughing fit to where she gags and there have been a couple times where she has vomited.  She is never had a chest x-ray since this cough started 6 months ago.  She does mention that in the past she has had  steroids for this and it has helped.  She does not have an albuterol inhaler.  She denies wheezing, tightness in her chest, shortness of breath, and nocturnal awakenings due to breathing problems.     Drug Allergies:  Allergies  Allergen Reactions   Augmentin [Amoxicillin-Pot Clavulanate] Rash    Review of Systems: Review of Systems  Constitutional:  Negative for chills and fever.  HENT:         Reports clear rhinorrhea and nasal congestion that is worse at night.  Denies postnasal drip.  Eyes:        Denies itchy watery eyes  Respiratory:  Positive for cough. Negative for shortness of breath and wheezing.   Cardiovascular:  Negative for chest pain and palpitations.  Gastrointestinal:        Denies heartburn or reflux symptoms  Genitourinary:  Negative for frequency.  Skin:  Negative for itching and rash.  Endo/Heme/Allergies:  Positive for environmental allergies.    Physical Exam: BP 112/80    Pulse 90    Temp 98.4 F (36.9 C) (Temporal)    Resp 16    Ht 5\' 3"  (1.6 m)    Wt 145 lb 6.4 oz (66 kg)    SpO2 99%    BMI 25.76 kg/m    Physical Exam Constitutional:      Appearance: Normal appearance.  HENT:     Head:  Normocephalic and atraumatic.     Comments: Pharynx normal, eyes normal, ears normal, nose: Bilateral lower turbinates moderately edematous and slightly erythematous with clear drainage noted    Right Ear: Tympanic membrane, ear canal and external ear normal.     Left Ear: Tympanic membrane, ear canal and external ear normal.     Mouth/Throat:     Mouth: Mucous membranes are moist.     Pharynx: Oropharynx is clear.  Eyes:     Conjunctiva/sclera: Conjunctivae normal.  Cardiovascular:     Rate and Rhythm: Normal rate and regular rhythm.     Heart sounds: Normal heart sounds.  Pulmonary:     Effort: Pulmonary effort is normal.     Breath sounds: Normal breath sounds.     Comments: Lungs clear to auscultation Musculoskeletal:     Cervical back: Neck supple.   Skin:    General: Skin is warm.  Neurological:     Mental Status: She is alert and oriented to person, place, and time.  Psychiatric:        Mood and Affect: Mood normal.        Behavior: Behavior normal.        Thought Content: Thought content normal.        Judgment: Judgment normal.    Diagnostics: FVC 3.45 L, FEV1 2.87 L (91%).  Predicted FVC 3.68 L, predicted FEV1 3.16 L.  Spirometry indicates normal respiratory function.  Postbronchodilator response shows FVC 3.55 L, FEV1 2.95 L.  There is a 3% change in FEV1.  Assessment and Plan: 1. Seasonal and perennial allergic rhinitis   2. Chronic cough     Meds ordered this encounter  Medications   Albuterol Sulfate, sensor, (PROAIR DIGIHALER) 108 (90 Base) MCG/ACT AEPB    Sig: Inhale 2 puffs into the lungs every 4 (four) hours as needed (For cough and wheeze).    Dispense:  1 each    Refill:  1    ID: DIGIHALER Group: [id number] [id number]   fluticasone (FLONASE) 50 MCG/ACT nasal spray    Sig: Place 2 sprays into both nostrils daily as needed for allergies or rhinitis.    Dispense:  16 g    Refill:  5   Carbinoxamine Maleate 4 MG TABS    Sig: Take 1 tablet (4 mg total) by mouth every 8 (eight) hours as needed.    Dispense:  90 tablet    Refill:  5   EPINEPHrine (EPIPEN 2-PAK) 0.3 mg/0.3 mL IJ SOAJ injection    Sig: Inject 0.3 mg into the muscle once for 1 dose as directed    Dispense:  2 each    Refill:  1    Patient Instructions  1. Seasonal and perennial allergic rhinitis (grasses, ragweed, weeds, trees, indoor molds, outdoor molds, dust mites, cockroach, and mouse and mixed feather) - Continue taking: carbinoxamine 4mg  tablet at night as needed (can cause sleepiness and over drying)  - Wishes to start allergy injections. CPT codes given. Call our office after you call your insurance company to find out cost and if interested call our office to start allergy injections. - Stop ipratropium bromide nasal spray since  it made you feel like you were walking on a water bed -Stop Sudafed -Start Flonase (fluticasone) nasal spay 2 sprays each nostril once a day as needed for stuffy nose. In the right nostril, point the applicator out toward the right ear. In the left nostril, point the applicator out toward the left ear  2. Chronic cough Start Armonair 113 mcg 1 puff twice a day as recommended by Dr. Ernst Bowler per his message. (Demonstration given) May use albuterol 2 puffs every 4-6 hours as needed for cough, wheeze, tightness in chest, or shortness of breath (Sample of ProAir Respiclick given) Get PA/lateral chest x-ray due to cough. We will call you with results once they are back  Please let us know if this treatment plan is not working well for you Schedule a follow-up appointment in 6 weeks or sooner if needed         Reducing Pollen Exposure  The American Academy of Allergy, Asthma and Immunology suggests the following steps to reduce your exposure to pollen during allergy seasons.    Do not hang sheets or clothing out to dry; pollen may collect on these items. Do not mow lawns or spend time around freshly cut grass; mowing stirs up pollen. Keep windows closed at night.  Keep car windows closed while driving. Minimize morning activities outdoors, a time when pollen counts are usually at their highest. Stay indoors as much as possible when pollen counts or humidity is high and on windy days when pollen tends to remain in the air longer. Use air conditioning when possible.  Many air conditioners have filters that trap the pollen spores. Use a HEPA room air filter to remove pollen form the indoor air you breathe.  Control of Mold Allergen   Mold and fungi can grow on a variety of surfaces provided certain temperature and moisture conditions exist.  Outdoor molds grow on plants, decaying vegetation and soil.  The major outdoor mold, Alternaria and Cladosporium, are found in very high numbers during  hot and dry conditions.  Generally, a late Summer - Fall peak is seen for common outdoor fungal spores.  Rain will temporarily lower outdoor mold spore count, but counts rise rapidly when the rainy period ends.  The most important indoor molds are Aspergillus and Penicillium.  Dark, humid and poorly ventilated basements are ideal sites for mold growth.  The next most common sites of mold growth are the bathroom and the kitchen.  Outdoor (Seasonal) Mold Control  Positive outdoor molds via skin testing: Alternaria, Cladosporium, Bipolaris (Helminthsporium), Drechslera (Curvalaria), and Mucor  Use air conditioning and keep windows closed Avoid exposure to decaying vegetation. Avoid leaf raking. Avoid grain handling. Consider wearing a face mask if working in moldy areas.    Indoor (Perennial) Mold Control   Positive indoor molds via skin testing: Aspergillus, Fusarium, Aureobasidium (Pullulara), and Candida  Maintain humidity below 50%. Clean washable surfaces with 5% bleach solution. Remove sources e.g. contaminated carpets.    Control of Dust Mite Allergen    Dust mites play a major role in allergic asthma and rhinitis.  They occur in environments with high humidity wherever human skin is found.  Dust mites absorb humidity from the atmosphere (ie, they do not drink) and feed on organic matter (including shed human and animal skin).  Dust mites are a microscopic type of insect that you cannot see with the naked eye.  High levels of dust mites have been detected from mattresses, pillows, carpets, upholstered furniture, bed covers, clothes, soft toys and any woven material.  The principal allergen of the dust mite is found in its feces.  A gram of dust may contain 1,000 mites and 250,000 fecal particles.  Mite antigen is easily measured in the air during house cleaning activities.  Dust mites do not bite and do not cause  harm to humans, other than by triggering allergies/asthma.    Ways to  decrease your exposure to dust mites in your home:  Encase mattresses, box springs and pillows with a mite-impermeable barrier or cover   Wash sheets, blankets and drapes weekly in hot water (130 F) with detergent and dry them in a dryer on the hot setting.  Have the room cleaned frequently with a vacuum cleaner and a damp dust-mop.  For carpeting or rugs, vacuuming with a vacuum cleaner equipped with a high-efficiency particulate air (HEPA) filter.  The dust mite allergic individual should not be in a room which is being cleaned and should wait 1 hour after cleaning before going into the room. Do not sleep on upholstered furniture (eg, couches).   If possible removing carpeting, upholstered furniture and drapery from the home is ideal.  Horizontal blinds should be eliminated in the rooms where the person spends the most time (bedroom, study, television room).  Washable vinyl, roller-type shades are optimal. Remove all non-washable stuffed toys from the bedroom.  Wash stuffed toys weekly like sheets and blankets above.   Reduce indoor humidity to less than 50%.  Inexpensive humidity monitors can be purchased at most hardware stores.  Do not use a humidifier as can make the problem worse and are not recommended.  Control of Cockroach Allergen  Cockroach allergen has been identified as an important cause of acute attacks of asthma, especially in urban settings.  There are fifty-five species of cockroach that exist in the [place], however only three, the [place], [place] species produce allergen that can affect patients with Asthma.  Allergens can be obtained from fecal particles, egg casings and secretions from cockroaches.    Remove food sources. Reduce access to water. Seal access and entry points. Spray runways with 0.5-1% Diazinon or Chlorpyrifos Blow boric acid power under stoves and refrigerator. Place bait stations (hydramethylnon) at feeding  sites.          Return in about 6 weeks (around 06/07/2021), or if symptoms worsen or fail to improve.    Thank you for the opportunity to care for this patient.  Please do not hesitate to contact me with questions.  Althea Charon, FNP [facility]

## 2021-04-30 ENCOUNTER — Telehealth: Payer: Self-pay | Admitting: *Deleted

## 2021-04-30 ENCOUNTER — Other Ambulatory Visit (HOSPITAL_COMMUNITY): Payer: Self-pay

## 2021-04-30 NOTE — Telephone Encounter (Signed)
Ok to start allergy injections. Her FEV1  was 2.87 L (91%) at her last office visit.

## 2021-04-30 NOTE — Telephone Encounter (Signed)
Patient was scheduled to start allergy injections for March 3rd but I noticed that she was just started on a new inhaler last week and there was a chest x ray ordered that she has not had completed yet. I called the patient and she stated that yes it was to start allergy injections and that the physician wanted her to start shots, I asked her how her breathing was doing and she stated good, but I wanted to reach out first in regards to this before we make any vials.

## 2021-04-30 NOTE — Addendum Note (Signed)
Addended by: Valentina Shaggy on: 04/30/2021 06:45 PM   Modules accepted: Orders

## 2021-04-30 NOTE — Telephone Encounter (Signed)
I am fine with starting the allergy shots.   Salvatore Marvel, MD [facility]

## 2021-05-01 DIAGNOSIS — J301 Allergic rhinitis due to pollen: Secondary | ICD-10-CM | POA: Diagnosis not present

## 2021-05-01 NOTE — Progress Notes (Signed)
VIALS EXP 05-01-22

## 2021-05-01 NOTE — Progress Notes (Signed)
Aeroallergen Immunotherapy   Ordering Provider: Dr. Salvatore Marvel   Patient Details  Name: Veronica Allen  MRN: [id number]  Date of Birth: 10/25/1995   Order 1 of 2   Vial Label: G/W/RW/T   0.3 ml (Volume)  BAU Concentration -- 7 Grass Mix* 100,000 ([address] [place], [place], [place], [place], [place], Sweet [place], [place])  0.3 ml (Volume)  BAU Concentration -- [place] 10,000  0.3 ml (Volume)  1:20 Concentration -- Ragweed Mix  0.2 ml (Volume)  1:20 Concentration -- Cocklebur  0.2 ml (Volume)  1:20 Concentration -- Lamb's Quarters*  0.5 ml (Volume)  1:20 Concentration -- Eastern 10 Tree Mix (also Sweet Gum)  0.2 ml (Volume)  1:10 Concentration -- Cedar, red  0.2 ml (Volume)  1:10 Concentration -- Pecan Pollen    2.2  ml Extract Subtotal  2.8  ml Diluent  5.0  ml Maintenance Total   Schedule:  B   Blue Vial (1:100,000): Schedule B (6 doses)  Yellow Vial (1:10,000): Schedule B (6 doses)  Green Vial (1:1,000): Schedule B (6 doses)  Red Vial (1:100): Schedule B (6 doses)   Special Instructions: none

## 2021-05-01 NOTE — Progress Notes (Signed)
Aeroallergen Immunotherapy   Ordering Provider: Dr. Salvatore Marvel   Patient Details  Name: Veronica Allen  MRN: [id number]  Date of Birth: 21-Feb-1996   Order 2 of 2   Vial Label: DM/Molds/CR   0.2 ml (Volume)  1:20 Concentration -- Alternaria alternata  0.2 ml (Volume)  1:20 Concentration -- Cladosporium herbarum  0.2 ml (Volume)  1:10 Concentration -- Aspergillus mix  0.2 ml (Volume)  1:20 Concentration -- Bipolaris sorokiniana  0.2 ml (Volume)  1:20 Concentration -- Drechslera spicifera  0.2 ml (Volume)  1:10 Concentration -- Mucor plumbeus  0.2 ml (Volume)  1:10 Concentration -- Fusarium moniliforme  0.2 ml (Volume)  1:40 Concentration -- Aureobasidium pullulans  0.3 ml (Volume)  1:20 Concentration -- Cockroach, [place]  0.5 ml (Volume)   AU Concentration -- Mite Mix (DF 5,000 & DP 5,000)    2.4  ml Extract Subtotal  2.6  ml Diluent  5.0  ml Maintenance Total   Schedule:  B   Blue Vial (1:100,000): Schedule B (6 doses)  Yellow Vial (1:10,000): Schedule B (6 doses)  Green Vial (1:1,000): Schedule B (6 doses)  Red Vial (1:100): Schedule B (6 doses)   Special Instructions: none

## 2021-05-02 ENCOUNTER — Other Ambulatory Visit (HOSPITAL_COMMUNITY): Payer: Self-pay

## 2021-05-02 ENCOUNTER — Telehealth: Payer: Self-pay | Admitting: Family

## 2021-05-02 NOTE — Telephone Encounter (Signed)
Called and left a voicemail asking for patient to return call to discuss.  °

## 2021-05-02 NOTE — Telephone Encounter (Signed)
Please call and see if Amilliana has completed her chest x-ray that was ordered at her last visit.  Thank you, Althea Charon, FNP

## 2021-05-03 ENCOUNTER — Ambulatory Visit (HOSPITAL_COMMUNITY)
Admission: RE | Admit: 2021-05-03 | Discharge: 2021-05-03 | Disposition: A | Discharge status: Home / Self Care | Payer: 59 | Source: Ambulatory Visit | Attending: Family | Admitting: Family

## 2021-05-03 ENCOUNTER — Other Ambulatory Visit: Payer: Self-pay

## 2021-05-03 DIAGNOSIS — R053 Chronic cough: Secondary | ICD-10-CM | POA: Insufficient documentation

## 2021-05-03 DIAGNOSIS — J3089 Other allergic rhinitis: Secondary | ICD-10-CM | POA: Diagnosis not present

## 2021-05-03 NOTE — Telephone Encounter (Signed)
Great. Thank you.

## 2021-05-03 NOTE — Telephone Encounter (Signed)
Called and spoke with the patient and she went and got the chest x ray today.

## 2021-05-06 ENCOUNTER — Encounter: Payer: Self-pay | Admitting: Allergy & Immunology

## 2021-05-06 ENCOUNTER — Encounter: Payer: Self-pay | Admitting: Family Medicine

## 2021-05-06 ENCOUNTER — Other Ambulatory Visit (HOSPITAL_COMMUNITY): Payer: Self-pay

## 2021-05-06 NOTE — Progress Notes (Signed)
Please let Veronica Allen know that her chest x-ray shows probable bilateral healed rib fractures. Has she ever broken her ribs? Other wise her chest x-ray was normal.

## 2021-05-08 ENCOUNTER — Other Ambulatory Visit (HOSPITAL_COMMUNITY): Payer: Self-pay

## 2021-05-08 MED ORDER — VALACYCLOVIR HCL 1 G PO TABS
ORAL_TABLET | ORAL | 6 refills | Status: DC
  Filled 2021-05-08: qty 4, 1d supply, fill #0
  Filled 2022-01-13 – 2022-01-15 (×2): qty 4, 1d supply, fill #1
  Filled 2022-02-28 – 2022-03-21 (×2): qty 4, 1d supply, fill #2

## 2021-05-08 MED ORDER — VALACYCLOVIR HCL 1 G PO TABS
1000.0000 mg | ORAL_TABLET | Freq: Every day | ORAL | 5 refills | Status: DC
  Filled 2021-05-08 – 2021-05-21 (×2): qty 30, 30d supply, fill #0
  Filled 2021-06-18: qty 30, 30d supply, fill #1
  Filled 2021-07-15: qty 30, 30d supply, fill #2
  Filled 2021-08-07: qty 30, 30d supply, fill #3
  Filled 2021-09-01: qty 30, 30d supply, fill #4
  Filled 2021-09-30: qty 30, 30d supply, fill #5

## 2021-05-08 NOTE — Telephone Encounter (Signed)
Nurses I would bump up the dose of the Valtrex to 1000 mg 1 daily, #30, 5 refills this would be to help prevent cold sores from coming  A separate prescription 1000 mg Valtrex 2 pills taken now then 2 pills taken 12 hours later, #4 with 6 refills can be used when cold sore occurs  If she is still having ongoing troubles she would need to do a follow-up visit in the office to discuss further-would not be able to handle via MyChart thank you

## 2021-05-21 ENCOUNTER — Other Ambulatory Visit (HOSPITAL_COMMUNITY): Payer: Self-pay

## 2021-05-24 ENCOUNTER — Other Ambulatory Visit: Payer: Self-pay

## 2021-05-24 ENCOUNTER — Ambulatory Visit (INDEPENDENT_AMBULATORY_CARE_PROVIDER_SITE_OTHER): Payer: 59

## 2021-05-24 DIAGNOSIS — J309 Allergic rhinitis, unspecified: Secondary | ICD-10-CM | POA: Diagnosis not present

## 2021-05-24 NOTE — Progress Notes (Signed)
Patient came in today and started her allergy injections. Patient started in her blue vial on schedule B with Grass, Weeds, Tree, and Ragweed. She also received Dust mite, Molds, and Cock roach getting injections weekly. Patient signed her consent form and had her epi pen. Patient waited in the lobby for thirty minutes without an issue. Patient was informed of the injection room hours as well as the protocol for immunotherapy. Patient had no questions or concerns during her visit here. Patient only stated that her left arm was itching but it was nothing serious. Patient verbalized that she took her antihistamine the night before. I advised patient to keep track of the arm and if she continues to have issues she should try switching her routine to taking her antihistamines the day of her injections if this continues to be an issue, patient verbalized understanding and agreed to do so.  ?

## 2021-05-27 ENCOUNTER — Telehealth: Payer: Self-pay

## 2021-05-27 NOTE — Telephone Encounter (Signed)
Called patient - DOB verified - regarding myChart message going unanswered. Patient advised that the Armonair nor ProAir  helped with her cough so she stopped both - the cough has since gotten better with the Ryvent, she feels. ?

## 2021-05-27 NOTE — Telephone Encounter (Signed)
Called patient - DOB verified - advised Armonair nor ProAir helped with postnasal drip either. She feels Ryvent has helped her the most with the cough.   ? ?I advised patient, I would forward this message with the updated information to the provider - glad she was felling much better as well. ?

## 2021-05-31 ENCOUNTER — Ambulatory Visit (INDEPENDENT_AMBULATORY_CARE_PROVIDER_SITE_OTHER): Payer: 59

## 2021-05-31 DIAGNOSIS — J309 Allergic rhinitis, unspecified: Secondary | ICD-10-CM | POA: Diagnosis not present

## 2021-06-06 NOTE — Patient Instructions (Addendum)
1. Seasonal and perennial allergic rhinitis (grasses, ragweed, weeds, trees, indoor molds, outdoor molds, dust mites, cockroach, and mouse and mixed feather) ?-in the past ipratropium bromide nasal spray made her feel like she was walking on a water bed ?- Continue taking: carbinoxamine '4mg'$  tablet at night as needed (can cause sleepiness and over drying)  ?- Continue allergy injections per protocol and have access to epinephrine auto injector device ?-Continue Flonase (fluticasone) nasal spay 2 sprays each nostril once a day as needed for stuffy nose.  ? ?2. Chronic cough ?See plan above for seasonal and perennial allergic rhinitis ?Stop Armonair and ProAir due to cough not being better while on and it causing cold sores ? ?Please let us know if this treatment plan is not working well for you ?Schedule a follow-up appointment in 6 months or sooner if needed ? ? ? ? ? ? ? ? ?Reducing Pollen Exposure ? ?The American Academy of Allergy, Asthma and Immunology suggests the following steps to reduce your exposure to pollen during allergy seasons. ?   ?Do not hang sheets or clothing out to dry; pollen may collect on these items. ?Do not mow lawns or spend time around freshly cut grass; mowing stirs up pollen. ?Keep windows closed at night.  Keep car windows closed while driving. ?Minimize morning activities outdoors, a time when pollen counts are usually at their highest. ?Stay indoors as much as possible when pollen counts or humidity is high and on windy days when pollen tends to remain in the air longer. ?Use air conditioning when possible.  Many air conditioners have filters that trap the pollen spores. ?Use a HEPA room air filter to remove pollen form the indoor air you breathe. ? ?Control of Mold Allergen  ? ?Mold and fungi can grow on a variety of surfaces provided certain temperature and moisture conditions exist.  Outdoor molds grow on plants, decaying vegetation and soil.  The major outdoor mold, Alternaria and  Cladosporium, are found in very high numbers during hot and dry conditions.  Generally, a late Summer - Fall peak is seen for common outdoor fungal spores.  Rain will temporarily lower outdoor mold spore count, but counts rise rapidly when the rainy period ends.  The most important indoor molds are Aspergillus and Penicillium.  Dark, humid and poorly ventilated basements are ideal sites for mold growth.  The next most common sites of mold growth are the bathroom and the kitchen. ? ?Outdoor (Seasonal) Mold Control ? ?Positive outdoor molds via skin testing: Alternaria, Cladosporium, Bipolaris (Helminthsporium), Drechslera (Curvalaria), and Mucor ? ?Use air conditioning and keep windows closed ?Avoid exposure to decaying vegetation. ?Avoid leaf raking. ?Avoid grain handling. ?Consider wearing a face mask if working in moldy areas.  ? ? ?Indoor (Perennial) Mold Control  ? ?Positive indoor molds via skin testing: Aspergillus, Fusarium, Aureobasidium (Pullulara), and Candida ? ?Maintain humidity below 50%. ?Clean washable surfaces with 5% bleach solution. ?Remove sources e.g. contaminated carpets. ? ? ? ?Control of Dust Mite Allergen ? ? ? ?Dust mites play a major role in allergic asthma and rhinitis.  They occur in environments with high humidity wherever human skin is found.  Dust mites absorb humidity from the atmosphere (ie, they do not drink) and feed on organic matter (including shed human and animal skin).  Dust mites are a microscopic type of insect that you cannot see with the naked eye.  High levels of dust mites have been detected from mattresses, pillows, carpets, upholstered furniture, bed covers, clothes,  soft toys and any woven material.  The principal allergen of the dust mite is found in its feces.  A gram of dust may contain 1,000 mites and 250,000 fecal particles.  Mite antigen is easily measured in the air during house cleaning activities.  Dust mites do not bite and do not cause harm to humans, other  than by triggering allergies/asthma. ?   ?Ways to decrease your exposure to dust mites in your home:  ?Encase mattresses, box springs and pillows with a mite-impermeable barrier or cover   ?Wash sheets, blankets and drapes weekly in hot water (130? F) with detergent and dry them in a dryer on the hot setting.  ?Have the room cleaned frequently with a vacuum cleaner and a damp dust-mop.  For carpeting or rugs, vacuuming with a vacuum cleaner equipped with a high-efficiency particulate air (HEPA) filter.  The dust mite allergic individual should not be in a room which is being cleaned and should wait 1 hour after cleaning before going into the room. ?Do not sleep on upholstered furniture (eg, couches).   ?If possible removing carpeting, upholstered furniture and drapery from the home is ideal.  Horizontal blinds should be eliminated in the rooms where the person spends the most time (bedroom, study, television room).  Washable vinyl, roller-type shades are optimal. ?Remove all non-washable stuffed toys from the bedroom.  Wash stuffed toys weekly like sheets and blankets above.   ?Reduce indoor humidity to less than 50%.  Inexpensive humidity monitors can be purchased at most hardware stores.  Do not use a humidifier as can make the problem worse and are not recommended. ? ?Control of Cockroach Allergen ? ?Cockroach allergen has been identified as an important cause of acute attacks of asthma, especially in urban settings.  There are fifty-five species of cockroach that exist in the [place], however only three, the [place], [place] species produce allergen that can affect patients with Asthma.  Allergens can be obtained from fecal particles, egg casings and secretions from cockroaches. ?   ?Remove food sources. ?Reduce access to water. ?Seal access and entry points. ?Spray runways with 0.5-1% Diazinon or Chlorpyrifos ?Blow boric acid power under stoves and refrigerator. ?Place bait stations  (hydramethylnon) at feeding sites. ? ? ? ? ? ? ? ? ?

## 2021-06-07 ENCOUNTER — Ambulatory Visit: Payer: 59 | Admitting: Family

## 2021-06-07 ENCOUNTER — Other Ambulatory Visit: Payer: Self-pay

## 2021-06-07 ENCOUNTER — Ambulatory Visit: Payer: Self-pay

## 2021-06-07 ENCOUNTER — Other Ambulatory Visit (HOSPITAL_COMMUNITY): Payer: Self-pay

## 2021-06-07 ENCOUNTER — Encounter: Payer: Self-pay | Admitting: Family

## 2021-06-07 VITALS — BP 124/80 | HR 96 | Temp 97.6°F | Resp 18

## 2021-06-07 DIAGNOSIS — J302 Other seasonal allergic rhinitis: Secondary | ICD-10-CM

## 2021-06-07 DIAGNOSIS — R053 Chronic cough: Secondary | ICD-10-CM | POA: Diagnosis not present

## 2021-06-07 DIAGNOSIS — J309 Allergic rhinitis, unspecified: Secondary | ICD-10-CM

## 2021-06-07 MED ORDER — CARBINOXAMINE MALEATE 4 MG PO TABS
4.0000 mg | ORAL_TABLET | Freq: Three times a day (TID) | ORAL | 5 refills | Status: DC | PRN
  Filled 2021-06-07: qty 90, 30d supply, fill #0
  Filled 2021-08-07: qty 90, 30d supply, fill #1
  Filled 2021-11-28: qty 90, 30d supply, fill #2
  Filled 2022-02-10: qty 90, 30d supply, fill #3
  Filled 2022-05-19: qty 90, 30d supply, fill #0
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
## 2021-06-10 ENCOUNTER — Other Ambulatory Visit (HOSPITAL_COMMUNITY): Payer: Self-pay

## 2021-06-14 ENCOUNTER — Ambulatory Visit (INDEPENDENT_AMBULATORY_CARE_PROVIDER_SITE_OTHER): Payer: 59

## 2021-06-14 DIAGNOSIS — J309 Allergic rhinitis, unspecified: Secondary | ICD-10-CM | POA: Diagnosis not present

## 2021-06-17 ENCOUNTER — Encounter: Payer: Self-pay | Admitting: Obstetrics & Gynecology

## 2021-06-18 ENCOUNTER — Other Ambulatory Visit (HOSPITAL_COMMUNITY): Payer: Self-pay

## 2021-06-18 MED ORDER — DROSPIRENONE-ESTETROL 3-14.2 MG PO TABS
1.0000 | ORAL_TABLET | Freq: Every day | ORAL | 12 refills | Status: DC
  Filled 2021-06-18: qty 28, 28d supply, fill #0

## 2021-06-19 ENCOUNTER — Other Ambulatory Visit: Payer: Self-pay | Admitting: *Deleted

## 2021-06-19 ENCOUNTER — Telehealth: Payer: Self-pay | Admitting: Obstetrics & Gynecology

## 2021-06-19 MED ORDER — DROSPIRENONE-ESTETROL 3-14.2 MG PO TABS: 1.0000 | ORAL_TABLET | Freq: Every day | ORAL | 12 refills | Status: DC

## 2021-06-19 NOTE — Telephone Encounter (Signed)
Patient called stating that she is wanting to know the status of her medication prior authorization. Please contact pt ?

## 2021-06-20 ENCOUNTER — Other Ambulatory Visit: Payer: Self-pay | Admitting: *Deleted

## 2021-06-20 ENCOUNTER — Other Ambulatory Visit (HOSPITAL_COMMUNITY): Payer: Self-pay

## 2021-06-20 MED ORDER — DROSPIRENONE-ESTETROL 3-14.2 MG PO TABS: 1.0000 | ORAL_TABLET | Freq: Every day | ORAL | 12 refills | Status: DC

## 2021-06-21 ENCOUNTER — Ambulatory Visit (INDEPENDENT_AMBULATORY_CARE_PROVIDER_SITE_OTHER): Payer: 59

## 2021-06-21 DIAGNOSIS — J309 Allergic rhinitis, unspecified: Secondary | ICD-10-CM

## 2021-06-21 NOTE — Telephone Encounter (Signed)
Informed patient I have spoken with [facility] and they are to send over a request for more information in order to complete PA for [employer].  Patient will come by Monday to pick up a sample box.  ?

## 2021-06-26 ENCOUNTER — Ambulatory Visit (INDEPENDENT_AMBULATORY_CARE_PROVIDER_SITE_OTHER): Payer: 59

## 2021-06-26 DIAGNOSIS — J309 Allergic rhinitis, unspecified: Secondary | ICD-10-CM

## 2021-07-05 ENCOUNTER — Ambulatory Visit (INDEPENDENT_AMBULATORY_CARE_PROVIDER_SITE_OTHER): Payer: 59

## 2021-07-05 DIAGNOSIS — J309 Allergic rhinitis, unspecified: Secondary | ICD-10-CM

## 2021-07-12 ENCOUNTER — Ambulatory Visit (INDEPENDENT_AMBULATORY_CARE_PROVIDER_SITE_OTHER): Payer: 59

## 2021-07-12 DIAGNOSIS — J309 Allergic rhinitis, unspecified: Secondary | ICD-10-CM | POA: Diagnosis not present

## 2021-07-15 ENCOUNTER — Other Ambulatory Visit (HOSPITAL_COMMUNITY): Payer: Self-pay

## 2021-07-19 ENCOUNTER — Ambulatory Visit (INDEPENDENT_AMBULATORY_CARE_PROVIDER_SITE_OTHER): Payer: 59

## 2021-07-19 DIAGNOSIS — J309 Allergic rhinitis, unspecified: Secondary | ICD-10-CM | POA: Diagnosis not present

## 2021-07-26 ENCOUNTER — Ambulatory Visit (INDEPENDENT_AMBULATORY_CARE_PROVIDER_SITE_OTHER): Payer: 59

## 2021-07-26 DIAGNOSIS — J309 Allergic rhinitis, unspecified: Secondary | ICD-10-CM

## 2021-07-31 ENCOUNTER — Ambulatory Visit (INDEPENDENT_AMBULATORY_CARE_PROVIDER_SITE_OTHER): Payer: 59

## 2021-07-31 DIAGNOSIS — J309 Allergic rhinitis, unspecified: Secondary | ICD-10-CM | POA: Diagnosis not present

## 2021-08-07 ENCOUNTER — Other Ambulatory Visit (HOSPITAL_COMMUNITY): Payer: Self-pay

## 2021-08-07 ENCOUNTER — Ambulatory Visit (INDEPENDENT_AMBULATORY_CARE_PROVIDER_SITE_OTHER): Payer: 59

## 2021-08-07 DIAGNOSIS — J309 Allergic rhinitis, unspecified: Secondary | ICD-10-CM | POA: Diagnosis not present

## 2021-08-08 ENCOUNTER — Other Ambulatory Visit (HOSPITAL_COMMUNITY): Payer: Self-pay

## 2021-08-14 ENCOUNTER — Ambulatory Visit (INDEPENDENT_AMBULATORY_CARE_PROVIDER_SITE_OTHER): Payer: 59

## 2021-08-14 DIAGNOSIS — J309 Allergic rhinitis, unspecified: Secondary | ICD-10-CM | POA: Diagnosis not present

## 2021-08-23 ENCOUNTER — Ambulatory Visit (INDEPENDENT_AMBULATORY_CARE_PROVIDER_SITE_OTHER): Payer: 59

## 2021-08-23 DIAGNOSIS — J309 Allergic rhinitis, unspecified: Secondary | ICD-10-CM | POA: Diagnosis not present

## 2021-08-30 ENCOUNTER — Ambulatory Visit (INDEPENDENT_AMBULATORY_CARE_PROVIDER_SITE_OTHER): Payer: 59

## 2021-08-30 DIAGNOSIS — J309 Allergic rhinitis, unspecified: Secondary | ICD-10-CM

## 2021-09-02 ENCOUNTER — Other Ambulatory Visit (HOSPITAL_COMMUNITY): Payer: Self-pay

## 2021-09-06 ENCOUNTER — Ambulatory Visit (INDEPENDENT_AMBULATORY_CARE_PROVIDER_SITE_OTHER): Payer: 59

## 2021-09-06 DIAGNOSIS — J309 Allergic rhinitis, unspecified: Secondary | ICD-10-CM | POA: Diagnosis not present

## 2021-09-18 ENCOUNTER — Ambulatory Visit (INDEPENDENT_AMBULATORY_CARE_PROVIDER_SITE_OTHER): Payer: 59

## 2021-09-18 DIAGNOSIS — J309 Allergic rhinitis, unspecified: Secondary | ICD-10-CM | POA: Diagnosis not present

## 2021-09-27 ENCOUNTER — Ambulatory Visit (INDEPENDENT_AMBULATORY_CARE_PROVIDER_SITE_OTHER): Payer: 59

## 2021-09-27 DIAGNOSIS — J309 Allergic rhinitis, unspecified: Secondary | ICD-10-CM

## 2021-09-28 ENCOUNTER — Encounter: Payer: Self-pay | Admitting: Allergy & Immunology

## 2021-10-01 ENCOUNTER — Other Ambulatory Visit (HOSPITAL_COMMUNITY): Payer: Self-pay

## 2021-10-09 ENCOUNTER — Ambulatory Visit (INDEPENDENT_AMBULATORY_CARE_PROVIDER_SITE_OTHER): Payer: 59

## 2021-10-09 DIAGNOSIS — J309 Allergic rhinitis, unspecified: Secondary | ICD-10-CM

## 2021-10-16 ENCOUNTER — Ambulatory Visit (INDEPENDENT_AMBULATORY_CARE_PROVIDER_SITE_OTHER): Payer: 59

## 2021-10-16 DIAGNOSIS — J309 Allergic rhinitis, unspecified: Secondary | ICD-10-CM | POA: Diagnosis not present

## 2021-10-19 ENCOUNTER — Encounter: Payer: Self-pay | Admitting: Family Medicine

## 2021-10-21 ENCOUNTER — Other Ambulatory Visit (HOSPITAL_COMMUNITY): Payer: Self-pay

## 2021-10-21 MED ORDER — VALACYCLOVIR HCL 500 MG PO TABS
500.0000 mg | ORAL_TABLET | Freq: Every day | ORAL | 0 refills | Status: DC
  Filled 2021-10-21: qty 90, 90d supply, fill #0

## 2021-10-23 ENCOUNTER — Other Ambulatory Visit (HOSPITAL_COMMUNITY): Payer: Self-pay

## 2021-10-23 MED ORDER — VALACYCLOVIR HCL 1 G PO TABS
1000.0000 mg | ORAL_TABLET | Freq: Every day | ORAL | 5 refills | Status: DC
  Filled 2021-10-23 – 2021-11-28 (×2): qty 30, 30d supply, fill #0
  Filled 2021-12-18: qty 8, 8d supply, fill #1
  Filled 2021-12-24: qty 30, 30d supply, fill #2
  Filled 2022-01-13 – 2022-01-17 (×3): qty 30, 30d supply, fill #3
  Filled 2022-02-10: qty 30, 30d supply, fill #4
  Filled 2022-03-05: qty 30, 30d supply, fill #5
  Filled 2022-03-31: qty 22, 22d supply, fill #6
  Filled ????-??-??: fill #3

## 2021-10-25 ENCOUNTER — Ambulatory Visit (INDEPENDENT_AMBULATORY_CARE_PROVIDER_SITE_OTHER): Payer: 59

## 2021-10-25 DIAGNOSIS — J309 Allergic rhinitis, unspecified: Secondary | ICD-10-CM

## 2021-11-06 ENCOUNTER — Ambulatory Visit (INDEPENDENT_AMBULATORY_CARE_PROVIDER_SITE_OTHER): Payer: 59

## 2021-11-06 DIAGNOSIS — J309 Allergic rhinitis, unspecified: Secondary | ICD-10-CM | POA: Diagnosis not present

## 2021-11-13 ENCOUNTER — Ambulatory Visit (INDEPENDENT_AMBULATORY_CARE_PROVIDER_SITE_OTHER): Payer: 59

## 2021-11-13 DIAGNOSIS — J309 Allergic rhinitis, unspecified: Secondary | ICD-10-CM

## 2021-11-22 ENCOUNTER — Ambulatory Visit (INDEPENDENT_AMBULATORY_CARE_PROVIDER_SITE_OTHER): Payer: 59

## 2021-11-22 DIAGNOSIS — J309 Allergic rhinitis, unspecified: Secondary | ICD-10-CM

## 2021-11-28 ENCOUNTER — Other Ambulatory Visit (HOSPITAL_COMMUNITY): Payer: Self-pay

## 2021-12-11 ENCOUNTER — Telehealth: Payer: Self-pay

## 2021-12-11 NOTE — Telephone Encounter (Signed)
Patient called to reschedule her office visit for 01/10/22. She plans to get her next allergy shot on 12/20/21. Her last injection was on Nov 26, 2021. She had a death in the family along with family and work issues. Covid is going around at her job so to be on the safe side she would like to wait another week to get allergy injections.

## 2021-12-18 ENCOUNTER — Other Ambulatory Visit (HOSPITAL_COMMUNITY): Payer: Self-pay

## 2021-12-20 ENCOUNTER — Ambulatory Visit (INDEPENDENT_AMBULATORY_CARE_PROVIDER_SITE_OTHER): Payer: 59

## 2021-12-20 DIAGNOSIS — J309 Allergic rhinitis, unspecified: Secondary | ICD-10-CM

## 2021-12-24 ENCOUNTER — Other Ambulatory Visit (HOSPITAL_COMMUNITY): Payer: Self-pay

## 2021-12-27 ENCOUNTER — Ambulatory Visit: Payer: 59 | Admitting: Allergy & Immunology

## 2021-12-31 IMAGING — DX DG FOOT COMPLETE 3+V*R*
3 series · 3 of 3 positions shown · non-contrast
Comparison: None.

CLINICAL DATA: Right foot pain

EXAM:
RIGHT FOOT COMPLETE - 3+ VIEW

[foot ap]
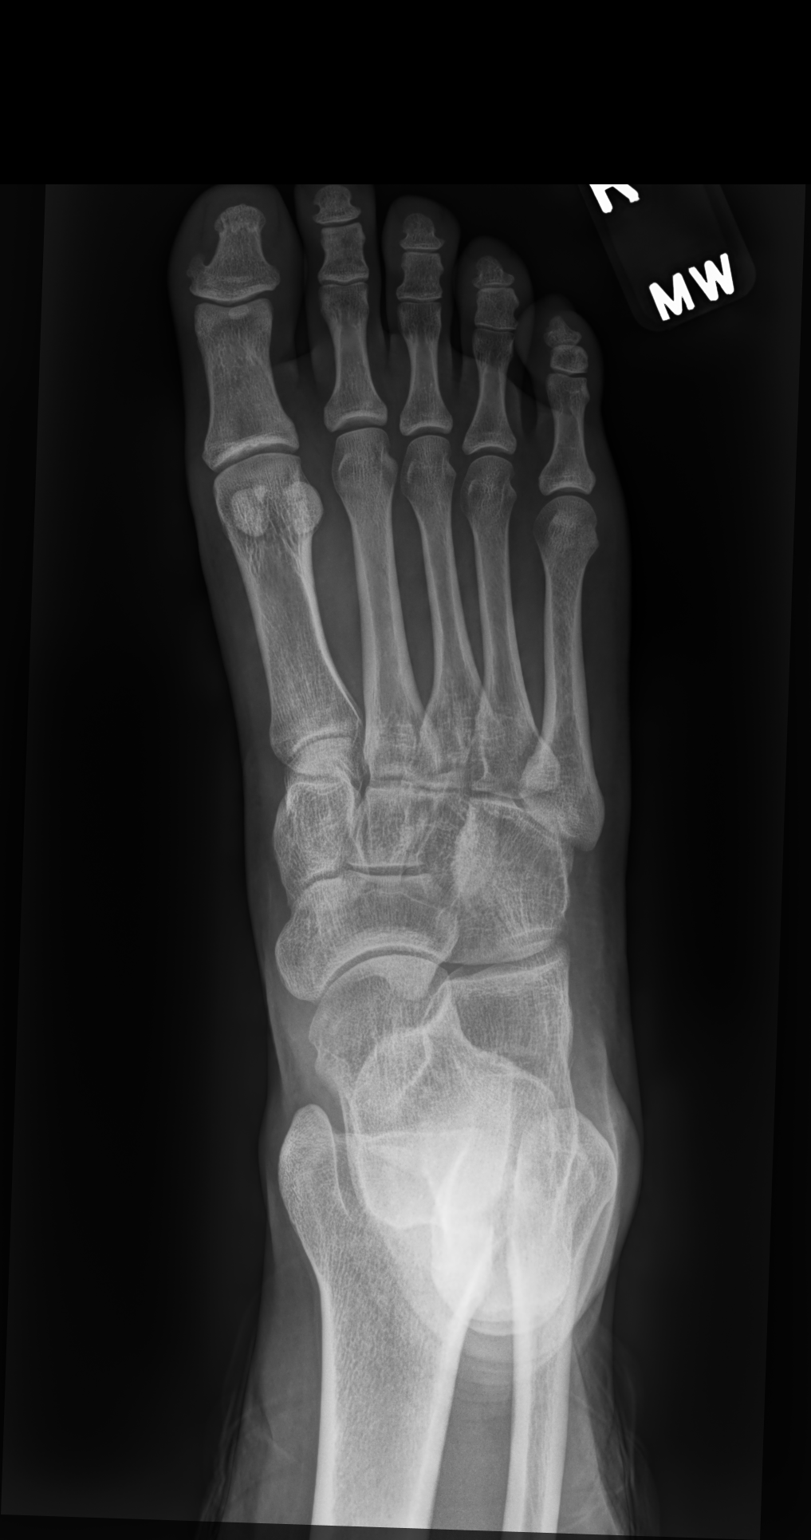

[foot mlo]
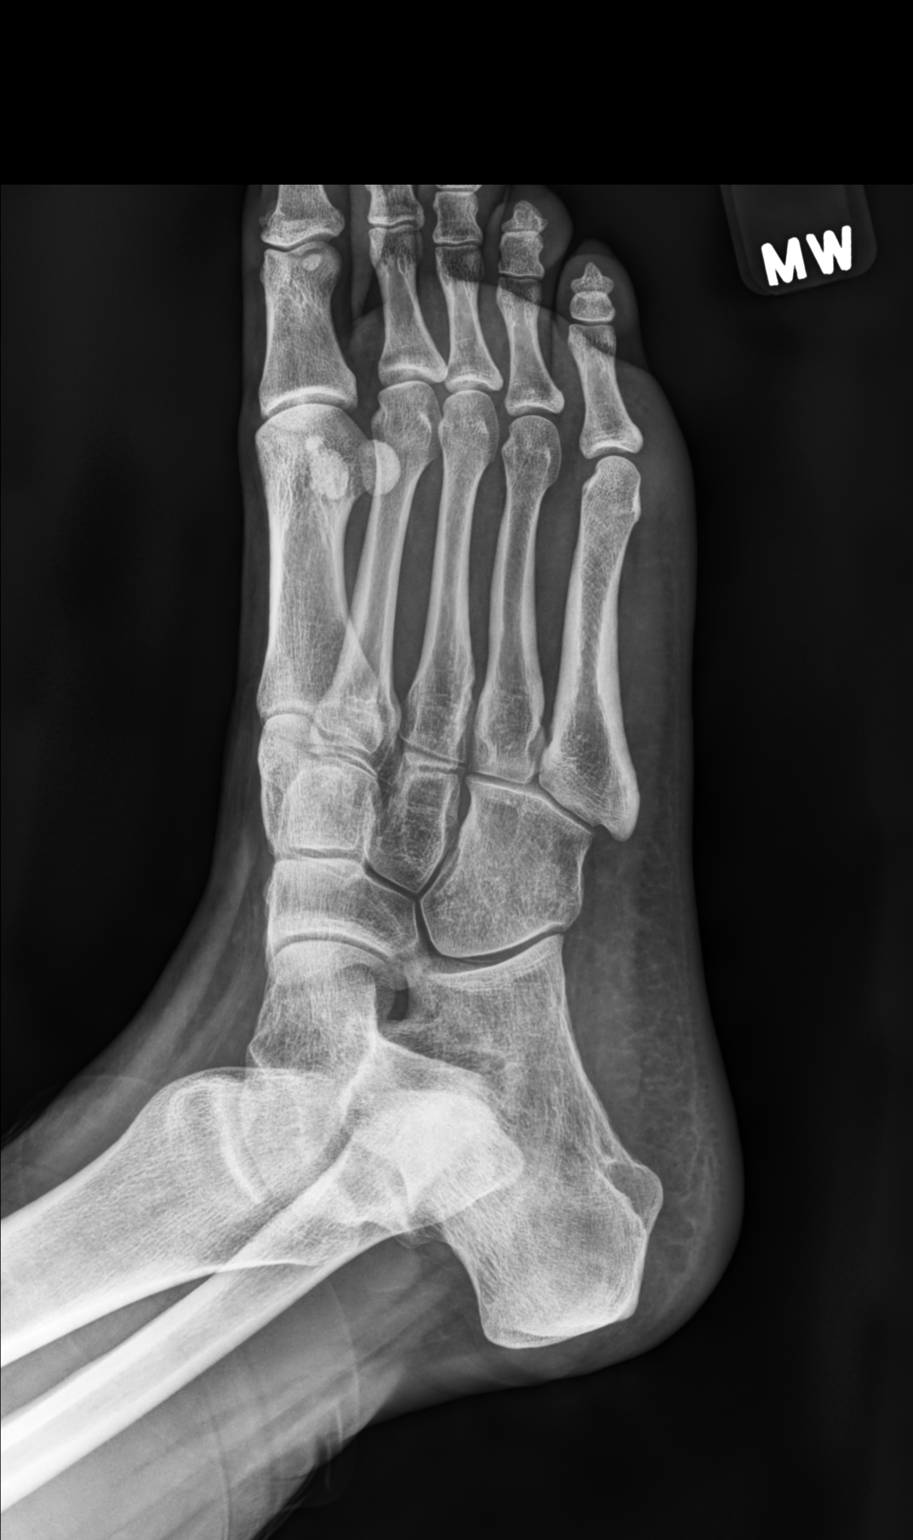

[foot lat]
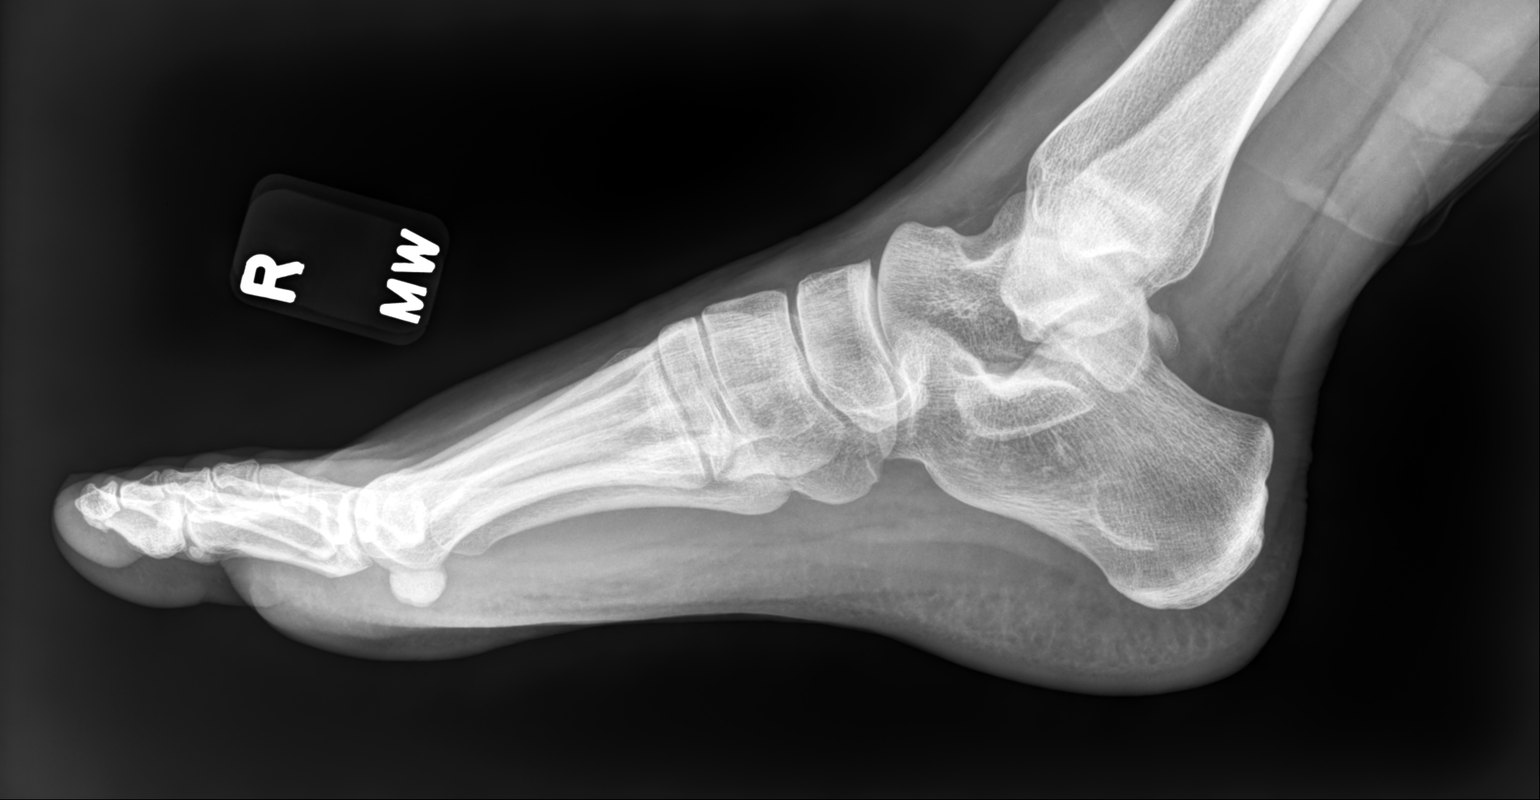

[3 of 3 positions shown; findings below may reference images not displayed]

FINDINGS: There is no evidence of fracture or dislocation. There is no
evidence of arthropathy or other focal bone abnormality. Soft
tissues are unremarkable.
IMPRESSION: Negative.

## 2022-01-01 ENCOUNTER — Ambulatory Visit (INDEPENDENT_AMBULATORY_CARE_PROVIDER_SITE_OTHER): Payer: 59 | Admitting: *Deleted

## 2022-01-01 DIAGNOSIS — J309 Allergic rhinitis, unspecified: Secondary | ICD-10-CM

## 2022-01-07 ENCOUNTER — Ambulatory Visit: Payer: 59 | Admitting: Nurse Practitioner

## 2022-01-07 VITALS — BP 106/71 | HR 72 | Temp 98.1°F | Ht 63.0 in | Wt 146.0 lb

## 2022-01-07 DIAGNOSIS — M7062 Trochanteric bursitis, left hip: Secondary | ICD-10-CM

## 2022-01-07 MED ORDER — PREDNISONE 20 MG PO TABS: 20.0000 mg | ORAL_TABLET | Freq: Two times a day (BID) | ORAL | 0 refills | Status: AC

## 2022-01-07 NOTE — Progress Notes (Signed)
   Subjective:    Patient ID: Veronica Allen, female    DOB: 11-Jun-1995, 26 y.o.   MRN: [id number]  HPI  26 year old female patient presents to clinic today with complaints of left hip pain that started yesterday and has gotten progressively worse.  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  Patient states that pain hurts when lying down and walking.  Patient states that sitting makes the pain better.    Patient denies any injury to her hip.    Patient does not like taking ibuprofen or Tylenol because it makes her nauseous.  Patient states that she has only been using heat to the area for pain management.    Patient has no other complaints today.    Review of Systems  Musculoskeletal:        Left hip pain  All other systems reviewed and are negative.      Objective:   Physical Exam Vitals reviewed.  Constitutional:      General: She is not in acute distress.    Appearance: Normal appearance. She is normal weight. She is not ill-appearing, toxic-appearing or diaphoretic.  HENT:     Head: Normocephalic and atraumatic.  Musculoskeletal:     Comments: Range of motion to left hip intact.  Point tenderness noted to left lateral hip.  Tenderness to hip joint with abduction and extension of hip.  Skin:    General: Skin is warm.     Capillary Refill: Capillary refill takes less than 2 seconds.  Neurological:     Mental Status: She is alert.     Comments: Grossly intact  Psychiatric:        Mood and Affect: Mood normal.        Behavior: Behavior normal.           Assessment & Plan:   1. Trochanteric bursitis of left hip -Suspect bursitis of left hip -Patient prefers not to take ibuprofen due to it making her nauseous.  Through shared decision making decided that patient will trial short course of prednisone as anti-inflammatory and physical therapy - predniSONE (DELTASONE) 20 MG tablet; Take 1  tablet (20 mg total) by mouth 2 (two) times daily with a meal for 3 days. (Patient not taking: Reported on 01/10/2022)  Dispense: 6 tablet; Refill: 0 - Ambulatory referral to Physical Therapy -If pain not better in 2 weeks return to clinic.  May consider orthopedic referral at that time.    Note:  This document was prepared using Dragon voice recognition software and may include unintentional dictation errors. Note - This record has been created using [employer].  Chart creation errors have been sought, but may not always  have been located. Such creation errors do not reflect on  the standard of medical care.

## 2022-01-09 DIAGNOSIS — J3089 Other allergic rhinitis: Secondary | ICD-10-CM | POA: Diagnosis not present

## 2022-01-09 NOTE — Progress Notes (Signed)
VIALS EXP 01-10-23

## 2022-01-09 NOTE — Progress Notes (Signed)
[place], [address] [place] [address] Dept: [phone]  FOLLOW UP NOTE  Patient ID: Veronica Allen, female    DOB: Sep 10, 1995  Age: 26 y.o. MRN: [id number] Date of Office Visit: 01/10/2022  Assessment  Chief Complaint: Allergic Rhinitis  (Some days she is snotty and sneezing )  HPI Veronica Allen is a 26 year old female who presents to the clinic for follow-up visit.  She was last seen in this clinic on 06/07/2021 by Althea Charon, FNP, for evaluation of cough and allergic rhinitis.  At today's visit, she reports her allergic rhinitis has been well controlled with carbinoxamine 4 mg once a day and infrequently takes carbinoxamine 4 mg twice a day with relief of symptoms..  She is not currently using nasal steroid spray or nasal saline spray.  She continues allergen immunotherapy with no large or local reactions.  She began allergen immunotherapy directed toward grass pollen, weed pollen, ragweed, dust mite, mold, and cockroach on 05/24/2021.  She reports a significant decrease in her symptoms of allergic rhinitis while continuing allergen immunotherapy.  Allergic conjunctivitis is reported as well controlled with no medical intervention at this time.  She denies cough at this time.  Her current medications are listed in the chart.   Drug Allergies:  Allergies  Allergen Reactions   Augmentin [Amoxicillin-Pot Clavulanate] Rash    Physical Exam: BP 118/82   Pulse 71   Temp 98.6 F (37 C)   Resp 18   Ht 5' 4.25" (1.632 m)   Wt 145 lb (65.8 kg)   SpO2 96%   BMI 24.70 kg/m    Physical Exam Vitals reviewed.  Constitutional:      Appearance: Normal appearance.  HENT:     Head: Normocephalic and atraumatic.     Right Ear: Tympanic membrane normal.     Left Ear: Tympanic membrane normal.     Nose:     Comments: Bilateral naris slightly erythematous with clear nasal drainage noted.  Pharynx normal.  Ears normal.  Eyes normal.    Mouth/Throat:     Pharynx:  Oropharynx is clear.  Eyes:     Conjunctiva/sclera: Conjunctivae normal.  Cardiovascular:     Rate and Rhythm: Normal rate and regular rhythm.     Heart sounds: Normal heart sounds. No murmur heard. Pulmonary:     Effort: Pulmonary effort is normal.     Breath sounds: Normal breath sounds.     Comments: Lungs clear to auscultation Musculoskeletal:        General: Normal range of motion.     Cervical back: Normal range of motion and neck supple.  Skin:    General: Skin is warm and dry.  Neurological:     Mental Status: She is alert and oriented to person, place, and time.  Psychiatric:        Mood and Affect: Mood normal.        Behavior: Behavior normal.        Thought Content: Thought content normal.        Judgment: Judgment normal.     Assessment and Plan: 1. Seasonal and perennial allergic rhinitis   2. Seasonal allergic conjunctivitis     Patient Instructions  Allergic rhinitis Continue allergen avoidance measures directed toward pollen, dust mite, mold, and cockroach as listed below Continue allergen immunotherapy per protocol and have access to an epinephrine autoinjector set Continue carbinoxamine 4 mg once at nighttime as needed for runny nose or itch. You may take carbinoxamine 4  mg up to 4 times a day if needed Continue Flonase 2 sprays in each nostril once a day as needed for stuffy nose  Allergic conjunctivitis Some over the counter eye drops include Pataday one drop in each eye once a day as needed for red, itchy eyes OR Zaditor one drop in each eye twice a day as needed for red itchy eyes.  Call the clinic if this treatment plan is not working well for you.  Follow up in 1 year or sooner if needed.  Reducing Pollen Exposure The American Academy of Allergy, Asthma and Immunology suggests the following steps to reduce your exposure to pollen during allergy seasons. Do not hang sheets or clothing out to dry; pollen may collect on these items. Do not mow  lawns or spend time around freshly cut grass; mowing stirs up pollen. Keep windows closed at night.  Keep car windows closed while driving. Minimize morning activities outdoors, a time when pollen counts are usually at their highest. Stay indoors as much as possible when pollen counts or humidity is high and on windy days when pollen tends to remain in the air longer. Use air conditioning when possible.  Many air conditioners have filters that trap the pollen spores. Use a HEPA room air filter to remove pollen form the indoor air you breathe.  Control of Mold Allergen Mold and fungi can grow on a variety of surfaces provided certain temperature and moisture conditions exist.  Outdoor molds grow on plants, decaying vegetation and soil.  The major outdoor mold, Alternaria and Cladosporium, are found in very high numbers during hot and dry conditions.  Generally, a late Summer - Fall peak is seen for common outdoor fungal spores.  Rain will temporarily lower outdoor mold spore count, but counts rise rapidly when the rainy period ends.  The most important indoor molds are Aspergillus and Penicillium.  Dark, humid and poorly ventilated basements are ideal sites for mold growth.  The next most common sites of mold growth are the bathroom and the kitchen.  Outdoor [employer] Use air conditioning and keep windows closed Avoid exposure to decaying vegetation. Avoid leaf raking. Avoid grain handling. Consider wearing a face mask if working in moldy areas.  Indoor Mold Control Maintain humidity below 50%. Clean washable surfaces with 5% bleach solution. Remove sources e.g. Contaminated carpets.   Control of Dust Mite Allergen Dust mites play a major role in allergic asthma and rhinitis. They occur in environments with high humidity wherever human skin is found. Dust mites absorb humidity from the atmosphere (ie, they do not drink) and feed on organic matter (including shed human and animal skin). Dust  mites are a microscopic type of insect that you cannot see with the naked eye. High levels of dust mites have been detected from mattresses, pillows, carpets, upholstered furniture, bed covers, clothes, soft toys and any woven material. The principal allergen of the dust mite is found in its feces. A gram of dust may contain 1,000 mites and 250,000 fecal particles. Mite antigen is easily measured in the air during house cleaning activities. Dust mites do not bite and do not cause harm to humans, other than by triggering allergies/asthma.  Ways to decrease your exposure to dust mites in your home:  1. Encase mattresses, box springs and pillows with a mite-impermeable barrier or cover  2. Wash sheets, blankets and drapes weekly in hot water (130 F) with detergent and dry them in a dryer on the hot setting.  3. Have the room cleaned frequently with a vacuum cleaner and a damp dust-mop. For carpeting or rugs, vacuuming with a vacuum cleaner equipped with a high-efficiency particulate air (HEPA) filter. The dust mite allergic individual should not be in a room which is being cleaned and should wait 1 hour after cleaning before going into the room.  4. Do not sleep on upholstered furniture (eg, couches).  5. If possible removing carpeting, upholstered furniture and drapery from the home is ideal. Horizontal blinds should be eliminated in the rooms where the person spends the most time (bedroom, study, television room). Washable vinyl, roller-type shades are optimal.  6. Remove all non-washable stuffed toys from the bedroom. Wash stuffed toys weekly like sheets and blankets above.  7. Reduce indoor humidity to less than 50%. Inexpensive humidity monitors can be purchased at most hardware stores. Do not use a humidifier as can make the problem worse and are not recommended.  Control of Cockroach Allergen Cockroach allergen has been identified as an important cause of acute attacks of asthma, especially  in urban settings.  There are fifty-five species of cockroach that exist in the [place], however only three, the [place], [place] species produce allergen that can affect patients with Asthma.  Allergens can be obtained from fecal particles, egg casings and secretions from cockroaches.    Remove food sources. Reduce access to water. Seal access and entry points. Spray runways with 0.5-1% Diazinon or Chlorpyrifos Blow boric acid power under stoves and refrigerator. Place bait stations (hydramethylnon) at feeding sites.    No follow-ups on file.    Thank you for the opportunity to care for this patient.  Please do not hesitate to contact me with questions.  Gareth Tykiera, FNP [facility]

## 2022-01-09 NOTE — Patient Instructions (Addendum)
Allergic rhinitis Continue allergen avoidance measures directed toward pollen, dust mite, mold, and cockroach as listed below Continue allergen immunotherapy per protocol and have access to an epinephrine autoinjector set Continue carbinoxamine 4 mg once at nighttime as needed for runny nose or itch. You may take carbinoxamine 4 mg up to 4 times a day if needed Continue Flonase 2 sprays in each nostril once a day as needed for stuffy nose  Allergic conjunctivitis Some over the counter eye drops include Pataday one drop in each eye once a day as needed for red, itchy eyes OR Zaditor one drop in each eye twice a day as needed for red itchy eyes.  Call the clinic if this treatment plan is not working well for you.  Follow up in 1 year or sooner if needed.  Reducing Pollen Exposure The American Academy of Allergy, Asthma and Immunology suggests the following steps to reduce your exposure to pollen during allergy seasons. Do not hang sheets or clothing out to dry; pollen may collect on these items. Do not mow lawns or spend time around freshly cut grass; mowing stirs up pollen. Keep windows closed at night.  Keep car windows closed while driving. Minimize morning activities outdoors, a time when pollen counts are usually at their highest. Stay indoors as much as possible when pollen counts or humidity is high and on windy days when pollen tends to remain in the air longer. Use air conditioning when possible.  Many air conditioners have filters that trap the pollen spores. Use a HEPA room air filter to remove pollen form the indoor air you breathe.  Control of Mold Allergen Mold and fungi can grow on a variety of surfaces provided certain temperature and moisture conditions exist.  Outdoor molds grow on plants, decaying vegetation and soil.  The major outdoor mold, Alternaria and Cladosporium, are found in very high numbers during hot and dry conditions.  Generally, a late Summer - Fall peak is  seen for common outdoor fungal spores.  Rain will temporarily lower outdoor mold spore count, but counts rise rapidly when the rainy period ends.  The most important indoor molds are Aspergillus and Penicillium.  Dark, humid and poorly ventilated basements are ideal sites for mold growth.  The next most common sites of mold growth are the bathroom and the kitchen.  Outdoor [employer] Use air conditioning and keep windows closed Avoid exposure to decaying vegetation. Avoid leaf raking. Avoid grain handling. Consider wearing a face mask if working in moldy areas.  Indoor Mold Control Maintain humidity below 50%. Clean washable surfaces with 5% bleach solution. Remove sources e.g. Contaminated carpets.   Control of Dust Mite Allergen Dust mites play a major role in allergic asthma and rhinitis. They occur in environments with high humidity wherever human skin is found. Dust mites absorb humidity from the atmosphere (ie, they do not drink) and feed on organic matter (including shed human and animal skin). Dust mites are a microscopic type of insect that you cannot see with the naked eye. High levels of dust mites have been detected from mattresses, pillows, carpets, upholstered furniture, bed covers, clothes, soft toys and any woven material. The principal allergen of the dust mite is found in its feces. A gram of dust may contain 1,000 mites and 250,000 fecal particles. Mite antigen is easily measured in the air during house cleaning activities. Dust mites do not bite and do not cause harm to humans, other than by triggering allergies/asthma.  Ways to decrease  your exposure to dust mites in your home:  1. Encase mattresses, box springs and pillows with a mite-impermeable barrier or cover  2. Wash sheets, blankets and drapes weekly in hot water (130 F) with detergent and dry them in a dryer on the hot setting.  3. Have the room cleaned frequently with a vacuum cleaner and a damp dust-mop. For  carpeting or rugs, vacuuming with a vacuum cleaner equipped with a high-efficiency particulate air (HEPA) filter. The dust mite allergic individual should not be in a room which is being cleaned and should wait 1 hour after cleaning before going into the room.  4. Do not sleep on upholstered furniture (eg, couches).  5. If possible removing carpeting, upholstered furniture and drapery from the home is ideal. Horizontal blinds should be eliminated in the rooms where the person spends the most time (bedroom, study, television room). Washable vinyl, roller-type shades are optimal.  6. Remove all non-washable stuffed toys from the bedroom. Wash stuffed toys weekly like sheets and blankets above.  7. Reduce indoor humidity to less than 50%. Inexpensive humidity monitors can be purchased at most hardware stores. Do not use a humidifier as can make the problem worse and are not recommended.  Control of Cockroach Allergen Cockroach allergen has been identified as an important cause of acute attacks of asthma, especially in urban settings.  There are fifty-five species of cockroach that exist in the [place], however only three, the [place], [place] species produce allergen that can affect patients with Asthma.  Allergens can be obtained from fecal particles, egg casings and secretions from cockroaches.    Remove food sources. Reduce access to water. Seal access and entry points. Spray runways with 0.5-1% Diazinon or Chlorpyrifos Blow boric acid power under stoves and refrigerator. Place bait stations (hydramethylnon) at feeding sites.

## 2022-01-10 ENCOUNTER — Ambulatory Visit (INDEPENDENT_AMBULATORY_CARE_PROVIDER_SITE_OTHER): Payer: 59

## 2022-01-10 ENCOUNTER — Ambulatory Visit: Payer: 59 | Admitting: Family Medicine

## 2022-01-10 ENCOUNTER — Encounter: Payer: Self-pay | Admitting: Family Medicine

## 2022-01-10 VITALS — BP 118/82 | HR 71 | Temp 98.6°F | Resp 18 | Ht 64.25 in | Wt 145.0 lb

## 2022-01-10 DIAGNOSIS — J3089 Other allergic rhinitis: Secondary | ICD-10-CM | POA: Diagnosis not present

## 2022-01-10 DIAGNOSIS — J309 Allergic rhinitis, unspecified: Secondary | ICD-10-CM

## 2022-01-10 DIAGNOSIS — J302 Other seasonal allergic rhinitis: Secondary | ICD-10-CM | POA: Insufficient documentation

## 2022-01-10 DIAGNOSIS — H1010 Acute atopic conjunctivitis, unspecified eye: Secondary | ICD-10-CM | POA: Insufficient documentation

## 2022-01-10 DIAGNOSIS — H1013 Acute atopic conjunctivitis, bilateral: Secondary | ICD-10-CM

## 2022-01-11 ENCOUNTER — Encounter: Payer: Self-pay | Admitting: Nurse Practitioner

## 2022-01-13 ENCOUNTER — Other Ambulatory Visit (HOSPITAL_COMMUNITY): Payer: Self-pay

## 2022-01-15 ENCOUNTER — Other Ambulatory Visit (HOSPITAL_COMMUNITY): Payer: Self-pay

## 2022-01-17 ENCOUNTER — Other Ambulatory Visit (HOSPITAL_COMMUNITY): Payer: Self-pay

## 2022-01-20 ENCOUNTER — Encounter: Payer: Self-pay | Admitting: Nurse Practitioner

## 2022-01-22 ENCOUNTER — Other Ambulatory Visit (HOSPITAL_COMMUNITY): Payer: Self-pay

## 2022-01-22 ENCOUNTER — Other Ambulatory Visit: Payer: Self-pay | Admitting: Nurse Practitioner

## 2022-01-22 ENCOUNTER — Ambulatory Visit (INDEPENDENT_AMBULATORY_CARE_PROVIDER_SITE_OTHER): Payer: 59 | Admitting: *Deleted

## 2022-01-22 DIAGNOSIS — J309 Allergic rhinitis, unspecified: Secondary | ICD-10-CM

## 2022-01-22 DIAGNOSIS — M7062 Trochanteric bursitis, left hip: Secondary | ICD-10-CM

## 2022-01-22 MED ORDER — PREDNISONE 10 MG PO TABS
ORAL_TABLET | ORAL | 0 refills | Status: AC
Start: 2022-01-22 — End: 2022-02-01
  Filled 2022-01-22: qty 21, 9d supply, fill #0

## 2022-01-23 ENCOUNTER — Other Ambulatory Visit: Payer: Self-pay | Admitting: Nurse Practitioner

## 2022-01-23 ENCOUNTER — Other Ambulatory Visit (HOSPITAL_COMMUNITY): Payer: Self-pay

## 2022-01-23 DIAGNOSIS — M7062 Trochanteric bursitis, left hip: Secondary | ICD-10-CM

## 2022-02-04 ENCOUNTER — Ambulatory Visit: Payer: 59 | Admitting: Orthopedic Surgery

## 2022-02-04 ENCOUNTER — Encounter: Payer: Self-pay | Admitting: Orthopedic Surgery

## 2022-02-04 VITALS — Ht 64.25 in

## 2022-02-04 DIAGNOSIS — M7062 Trochanteric bursitis, left hip: Secondary | ICD-10-CM | POA: Diagnosis not present

## 2022-02-04 NOTE — Patient Instructions (Signed)
Medications as needed.  Can consider topicals, creams, sprays and patches.  Physical therapy for your left hip pain.  Consider steroid injection if you continue to have issues in the left hip.  Please call if you would like to schedule an injection.

## 2022-02-04 NOTE — Progress Notes (Signed)
New Patient Visit  Assessment: Veronica Allen is a 26 y.o. female with the following: 1. Greater trochanteric bursitis of left hip   Plan: Nishika Parkhurst has pain over the lateral hip, which is consistent with greater trochanteric bursitis.  Prednisone has not been effective.  She is not taking NSAIDs consistently, as these upset her stomach.  She is yet to work with physical therapy, although her initial evaluation is scheduled for tomorrow. In the future, we can consider an injection.  She is not interested in an injection at this time.  However, if her symptoms do not improve, I would recommend proceeding with an injection.  This was discussed with the patient.  She states her understanding.  Follow-up: Return if symptoms worsen or fail to improve.  Subjective:  Chief Complaint  Patient presents with   Hip Pain    Lt hip pain for 1 mo. No injuries known and has taken 2 rounds of prednisone which helps but pain is coming back     History of Present Illness: Veronica Allen is a 26 y.o. female who has been referred by Mariane Baumgarten, NP for evaluation of lateral left hip pain.  She has had pain in the lateral left hip for the past month or so.  No specific onset.  She does have a history of dancing, but reports no prior injuries to her left hip.  She does not tolerate ibuprofen regularly, as this causes nausea.  She has taken 2 separate short courses of prednisone, which helps while she is taking the medication, but this is not sustained.  She has been referred to physical therapy, but has not started yet.  She is scheduled to have her first session with physical therapy tomorrow.  She has not had an injection.  Pain gets worse the longer she stands on it.  She is unable to lay on her left side.  Pain improves in her left hip when she sits down.   Review of Systems: No fevers or chills No numbness or tingling No chest pain No shortness of breath No bowel or bladder  dysfunction No GI distress No headaches   Medical History:  Past Medical History:  Diagnosis Date   Migraine 05/01/2020    Past Surgical History:  Procedure Laterality Date   WISDOM TOOTH EXTRACTION  06/2015    Family History  Problem Relation Age of Onset   Asthma Mother    Asthma Sister    Allergic rhinitis Sister    Eczema Sister    Cancer Maternal Grandfather        lung   Diabetes Paternal Grandmother    Diabetes Paternal Grandfather    Social History   Tobacco Use   Smoking status: Never   Smokeless tobacco: Never  Vaping Use   Vaping Use: Never used  Substance Use Topics   Alcohol use: Yes    Comment: social   Drug use: No    Allergies  Allergen Reactions   Augmentin [Amoxicillin-Pot Clavulanate] Rash    Current Meds  Medication Sig   Carbinoxamine Maleate 4 MG TABS Take 1 tablet (4 mg total) by mouth every 8 (eight) hours as needed.   Drospirenone-Estetrol 3-14.2 MG TABS Take 1 tablet by mouth daily.   EPINEPHrine (EPIPEN 2-PAK) 0.3 mg/0.3 mL IJ SOAJ injection Inject 0.3 mg into the muscle once for 1 dose as directed   valACYclovir (VALTREX) 1000 MG tablet Take 2 tablet by mouth now and then 2 tablets by mouth  12 hours later   valACYclovir (VALTREX) 1000 MG tablet Take 1 tablet (1,000 mg total) by mouth daily.    Objective: Ht 5' 4.25" (1.632 m)   BMI 24.70 kg/m   Physical Exam:  General: Alert and oriented. and No acute distress. Gait: Normal gait.  Evaluation left hip demonstrates no deformity.  No bruising.  No swelling.  She has tenderness to palpation over the greater trochanter.  She has pain with adduction across the midline.  Pain with resisted abduction.  IMAGING: No new imaging obtained today   New Medications:  No orders of the defined types were placed in this encounter.     Mordecai Rasmussen, MD  02/04/2022 2:44 PM

## 2022-02-04 NOTE — Therapy (Addendum)
OUTPATIENT PHYSICAL THERAPY LOWER EXTREMITY EVALUATION   Patient Name: Veronica Allen MRN: [id number] DOB:15-Jul-1995, 26 y.o., female Today's Date: 02/06/2022   PT End of Session - 02/05/22 1111     Visit Number 1    Number of Visits 6    Date for PT Re-Evaluation 02/26/22    Authorization Type [employer]    Progress Note Due on Visit 6    PT Start Time 1111    PT Stop Time 1154    PT Time Calculation (min) 43 min    Activity Tolerance Patient tolerated treatment well    Behavior During Therapy [facility] for tasks assessed/performed             Past Medical History:  Diagnosis Date   Migraine 05/01/2020   Past Surgical History:  Procedure Laterality Date   WISDOM TOOTH EXTRACTION  06/2015   Patient Active Problem List   Diagnosis Date Noted   Seasonal and perennial allergic rhinitis 01/10/2022   Seasonal allergic conjunctivitis 01/10/2022   Neuroma 05/07/2020   Migraine 05/01/2020    PCP: Dr. Sallee Lange  REFERRING PROVIDER: [facility], Trenton Gammon, FNP [facility]  REFERRING DIAG: M70.62 (ICD-10-CM) - Trochanteric bursitis of left hip  THERAPY DIAG:  Difficulty in walking, not elsewhere classified - Plan: PT plan of care cert/re-cert  Pain in left hip - Plan: PT plan of care cert/re-cert  Trochanteric bursitis of left hip - Plan: PT plan of care cert/re-cert  Rationale for Evaluation and Treatment: Rehabilitation  ONSET DATE: 1 month  SUBJECTIVE:   SUBJECTIVE STATEMENT: Patient reports that she has had pain for about a month; insidious onset.  Has 3 large dogs; walks them daily.  One may have ran into her but no known injury.  Better now than when it started but hurts with walking; tends to favor left side; prednisone x 2 doses; initially did radiate around to her glute and groin; worse at the end of the day.  Dr. 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 Yes: NPRS scale: 0-4/10 Pain location: left hip; gr. Troch;   Pain description: sore/ache; at worst throbbing Aggravating factors: walking; end of day; rotation hip in any way Relieving factors: rest  PRECAUTIONS: None  WEIGHT BEARING RESTRICTIONS: No  FALLS:  Has patient fallen in last 6 months? 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 troch   PALPATION: Tenderness left hip; gr troch   LOWER EXTREMITY MMT:  MMT Right eval Left eval  Hip flexion 5 4-*  Hip extension 5 4*  Hip abduction 5 4+*  Hip adduction    Hip internal rotation    Hip external rotation    Knee flexion    Knee extension 5 4+*  Ankle dorsiflexion 5 5  Ankle plantarflexion    Ankle inversion    Ankle eversion     (Blank rows = not tested)   FUNCTIONAL TESTS:  5 times sit to stand: 12 sec     TODAY'S TREATMENT:        physical therapy evaluation  DATE: 02/05/22    Education details: Patient educated on exam findings, POC, scope of PT, HEP. Person educated: Patient Education method: Explanation, Demonstration, and Handouts Education comprehension: verbalized understanding, returned demonstration, verbal cues required, and tactile cues required   HOME EXERCISE PROGRAM: Access Code: [email or web address] URL: [email or web address] Date: 02/05/2022 Prepared by: [facility] - Rehab  Exercises - Supine Bridge  - 2 x daily - 7 x weekly - 2 sets - 10 reps - Clamshell  - 2 x daily - 7 x weekly - 2 sets - 10 reps - Active Straight Leg Raise with Quad Set  - 2  x daily - 7 x weekly - 2 sets - 10 reps - Prone Hip Extension  - 2 x daily - 7 x weekly - 2 sets - 10 reps - Sidelying Hip Abduction  - 2 x daily - 7 x weekly - 2 sets - 10 reps - Standing ITB Stretch  - 1 x daily - 7 x weekly - 1 sets - 5 reps - 20" hold ASSESSMENT:  CLINICAL IMPRESSION: Patient is a 26 y.o. female who was seen today for physical therapy evaluation and treatment for M70.62 (ICD-10-CM) - Trochanteric bursitis of left hip.  Patient demonstrates muscle weakness, reduced ROM, and fascial restrictions which are likely contributing to symptoms of pain and are negatively impacting patient ability to perform ADLs and functional mobility tasks. Patient will benefit from skilled physical therapy services to address these deficits to reduce pain and improve level of function with ADLs and functional mobility tasks.   OBJECTIVE IMPAIRMENTS: Abnormal gait, decreased activity tolerance, decreased endurance, difficulty walking, decreased strength, increased edema, increased fascial restrictions, impaired perceived functional ability, and pain.   ACTIVITY LIMITATIONS: carrying, lifting, bending, standing, squatting, sleeping, stairs, locomotion level, and caring for others  PARTICIPATION LIMITATIONS: cleaning, shopping, community activity, occupation, and yard work  PREHAB POTENTIAL: Good  CLINICAL DECISION MAKING: Stable/uncomplicated  EVALUATION COMPLEXITY: Low   GOALS: Goals reviewed with patient? No  SHORT TERM GOALS: Target date: 02/19/2022  Patient will be independent with initial HEP  Baseline: Goal status: INITIAL  2.   Patient will report at least 50% improvement in overall symptoms and/or function to demonstrate improved functional mobility  Baseline:  Goal status: INITIAL    LONG TERM GOALS: Target date: 02/26/2022   Patient will be independent in self management strategies to improve quality of life and functional outcomes.   Baseline:  Goal status:  INITIAL  2.   Patient will report at least 75% improvement in overall symptoms and/or function to demonstrate improved functional mobility  Baseline:  Goal status: INITIAL  3.  Patient will increase left leg MMTs to 5/5 without pain to promote return to ambulation community distances with minimal deviation.  Baseline: see above Goal status: INITIAL  4.  Patient will improve FOTO score to predicted value  Baseline: 58 Goal status: INITIAL  5.  Patient will improve 5 times sit to stand score from 12 sec to 10 sec to demonstrate improved functional mobility and increased lower extremity strength.   Baseline:  Goal status: INITIAL    PLAN:  PT FREQUENCY: 1-2x/week  PT DURATION: 3 weeks  Therapeutic exercises, Therapeutic activity, Neuromuscular re-education, Balance training, Gait training, Patient/Family education, Joint manipulation, Joint mobilization, Stair training, Orthotic/Fit training, DME instructions, Aquatic Therapy, Dry Needling, Electrical stimulation, Spinal manipulation, Spinal mobilization, Cryotherapy, Moist heat, Compression bandaging, scar mobilization, Splintting, Taping, Traction, Ultrasound, Ionotophoresis '4mg'$ /ml Dexamethasone, and Manual therapy  PLAN FOR NEXT SESSION: Review HEP and  goals; progress hip strengthening and pain management as able.   7:40 AM, 02/06/22 Vee Bahe Small Kalyn Hofstra MPT [facility] [place] [phone]

## 2022-02-05 ENCOUNTER — Ambulatory Visit (HOSPITAL_COMMUNITY): Payer: 59 | Attending: Family Medicine

## 2022-02-05 DIAGNOSIS — M7062 Trochanteric bursitis, left hip: Secondary | ICD-10-CM | POA: Insufficient documentation

## 2022-02-05 DIAGNOSIS — R262 Difficulty in walking, not elsewhere classified: Secondary | ICD-10-CM | POA: Insufficient documentation

## 2022-02-05 DIAGNOSIS — M25552 Pain in left hip: Secondary | ICD-10-CM | POA: Diagnosis not present

## 2022-02-07 ENCOUNTER — Ambulatory Visit (INDEPENDENT_AMBULATORY_CARE_PROVIDER_SITE_OTHER): Payer: 59 | Admitting: *Deleted

## 2022-02-07 DIAGNOSIS — J309 Allergic rhinitis, unspecified: Secondary | ICD-10-CM

## 2022-02-10 ENCOUNTER — Other Ambulatory Visit (HOSPITAL_COMMUNITY): Payer: Self-pay

## 2022-02-10 ENCOUNTER — Encounter: Payer: Self-pay | Admitting: Family Medicine

## 2022-02-10 DIAGNOSIS — M25471 Effusion, right ankle: Secondary | ICD-10-CM

## 2022-02-11 NOTE — Telephone Encounter (Signed)
Nurses-please reach out to Veronica Allen-it would be fine to go ahead with x-ray of the right ankle.  Please order the x-ray stat so we will have results relatively soon.  I would not recommend an x-ray of the hip for this related issue.  Please have Veronica Allen verify when she fell off the porch so we can be aware of that detail.

## 2022-02-12 ENCOUNTER — Ambulatory Visit (HOSPITAL_COMMUNITY)
Admission: RE | Admit: 2022-02-12 | Discharge: 2022-02-12 | Disposition: A | Discharge status: Home / Self Care | Payer: 59 | Source: Ambulatory Visit | Attending: Family Medicine | Admitting: Family Medicine

## 2022-02-12 ENCOUNTER — Other Ambulatory Visit (HOSPITAL_COMMUNITY): Payer: Self-pay

## 2022-02-12 DIAGNOSIS — M25471 Effusion, right ankle: Secondary | ICD-10-CM | POA: Diagnosis not present

## 2022-02-12 DIAGNOSIS — S99911A Unspecified injury of right ankle, initial encounter: Secondary | ICD-10-CM | POA: Diagnosis not present

## 2022-02-12 MED ORDER — NEXTSTELLIS 3-14.2 MG PO TABS
1.0000 | ORAL_TABLET | Freq: Every day | ORAL | 12 refills | Status: DC
  Filled 2022-02-12: qty 28, 28d supply, fill #0
  Filled 2022-03-31: qty 28, 28d supply, fill #1
  Filled 2022-04-24: qty 28, 28d supply, fill #2
  Filled 2022-05-22: qty 28, 28d supply, fill #3

## 2022-02-14 ENCOUNTER — Other Ambulatory Visit (HOSPITAL_COMMUNITY): Payer: Self-pay

## 2022-02-17 ENCOUNTER — Other Ambulatory Visit (HOSPITAL_COMMUNITY): Payer: Self-pay

## 2022-02-20 ENCOUNTER — Ambulatory Visit (HOSPITAL_COMMUNITY): Payer: 59

## 2022-02-20 DIAGNOSIS — R262 Difficulty in walking, not elsewhere classified: Secondary | ICD-10-CM | POA: Diagnosis not present

## 2022-02-20 DIAGNOSIS — M25552 Pain in left hip: Secondary | ICD-10-CM

## 2022-02-20 DIAGNOSIS — M7062 Trochanteric bursitis, left hip: Secondary | ICD-10-CM | POA: Diagnosis not present

## 2022-02-20 NOTE — Therapy (Signed)
OUTPATIENT PHYSICAL THERAPY LOWER EXTREMITY EVALUATION   Patient Name: Veronica Allen MRN: [id number] DOB:09/23/1995, 26 y.o., female Today's Date: 02/20/2022   PT End of Session - 02/20/22 0728     Visit Number 2    Number of Visits 6    Date for PT Re-Evaluation 02/26/22    Authorization Type [employer]    Progress Note Due on Visit 6    PT Start Time 0727    PT Stop Time 0805    PT Time Calculation (min) 38 min    Activity Tolerance Patient tolerated treatment well    Behavior During Therapy [facility] for tasks assessed/performed             Past Medical History:  Diagnosis Date   Migraine 05/01/2020   Past Surgical History:  Procedure Laterality Date   WISDOM TOOTH EXTRACTION  06/2015   Patient Active Problem List   Diagnosis Date Noted   Seasonal and perennial allergic rhinitis 01/10/2022   Seasonal allergic conjunctivitis 01/10/2022   Neuroma 05/07/2020   Migraine 05/01/2020    PCP: Dr. Sallee Lange  REFERRING PROVIDER: [facility], Trenton Gammon, FNP [facility]  REFERRING DIAG: M70.62 (ICD-10-CM) - Trochanteric bursitis of left hip  THERAPY DIAG:  Difficulty in walking, not elsewhere classified  Pain in left hip  Trochanteric bursitis of left hip  Rationale for Evaluation and Treatment: Rehabilitation  ONSET DATE: 1 month  SUBJECTIVE:   SUBJECTIVE STATEMENT: Patient reports she had a fall onto left hip but overall is doing better; had a few days rest over Thanksgiving and that seemed to help more than anything.  About "50%" better   Eval:Patient reports that she has had pain for about a month; insidious onset.  Has 3 large dogs; walks them daily.  One may have ran into her but no known injury.  Better now than when it started but hurts with walking; tends to favor left side; prednisone x 2 doses; initially did radiate around to her glute and groin; worse at the end of the day.  Dr. 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 Yes: NPRS scale: 0-4/10 Pain location: left hip; gr. Troch;   Pain description: sore/ache; at worst throbbing Aggravating factors: walking; end of day; rotation hip in any way Relieving factors: rest  PRECAUTIONS: None  WEIGHT BEARING RESTRICTIONS: No  FALLS:  Has patient fallen in last 6 months? 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 troch   PALPATION: Tenderness left hip; gr troch   LOWER EXTREMITY MMT:  MMT Right eval Left eval  Hip flexion 5 4-*  Hip extension 5 4*  Hip abduction 5 4+*  Hip adduction    Hip internal rotation    Hip external rotation    Knee flexion    Knee extension 5 4+*  Ankle dorsiflexion 5 5  Ankle plantarflexion    Ankle inversion    Ankle eversion     (Blank rows = not tested)   FUNCTIONAL TESTS:  5 times sit to stand: 12 sec     TODAY'S  TREATMENT:       02/20/22 Review of HEP and goals Supine: Bridge 2 x 10 SLR 2 x 10 each Figure 4 stretch 20" x 5 Piriformis stretch knee to opposite shoulder 3 x 20"    Sidelying: Clam 2 x 10  Standing: IT band stretch 10" x 10       physical therapy evaluation                                                                                                                      DATE: 02/05/22    Education details: Patient educated on exam findings, POC, scope of PT, HEP. Person educated: Patient Education method: Explanation, Demonstration, and Handouts Education comprehension: verbalized understanding, returned demonstration, verbal cues required, and tactile cues required   HOME  EXERCISE PROGRAM: Access Code: [email or web address] URL: [email or web address] Date: 02/05/2022 Prepared by: AP - Rehab  Exercises - Supine Bridge  - 2 x daily - 7 x weekly - 2 sets - 10 reps - Clamshell  - 2 x daily - 7 x weekly - 2 sets - 10 reps - Active Straight Leg Raise with Quad Set  - 2 x daily - 7 x weekly - 2 sets - 10 reps - Prone Hip Extension  - 2 x daily - 7 x weekly - 2 sets - 10 reps - Sidelying Hip Abduction  - 2 x daily - 7 x weekly - 2 sets - 10 reps - Standing ITB Stretch  - 1 x daily - 7 x weekly - 1 sets - 5 reps - 20" hold ASSESSMENT:  CLINICAL IMPRESSION Today's session started with a review of HEP and goals and patient verbalizes agreement with set rehab goals. Added piriformis stretching today without issue; updated HEP. Noted negative Ober's test today.  Patient will benefit from continued skilled therapy services to address deficits and promote return to optimal function.       Eval:Patient is a 26 y.o. female who was seen today for physical therapy evaluation and treatment for M70.62 (ICD-10-CM) - Trochanteric bursitis of left hip.  Patient demonstrates muscle weakness, reduced ROM, and fascial restrictions which are likely contributing to symptoms of pain and are negatively impacting patient ability to perform ADLs and functional mobility tasks. Patient will benefit from skilled physical therapy services to address these deficits to reduce pain and improve level of function with ADLs and functional mobility tasks.   OBJECTIVE IMPAIRMENTS: Abnormal gait, decreased activity tolerance, decreased endurance, difficulty walking, decreased strength, increased edema, increased fascial restrictions, impaired perceived functional ability, and pain.   ACTIVITY LIMITATIONS: carrying, lifting, bending, standing, squatting, sleeping, stairs, locomotion level, and caring for others  PARTICIPATION LIMITATIONS: cleaning, shopping, community activity, occupation, and yard  work  PREHAB POTENTIAL: Good  CLINICAL DECISION MAKING: Stable/uncomplicated  EVALUATION COMPLEXITY: Low   GOALS: Goals reviewed with patient? Yes  SHORT TERM GOALS: Target date: 02/19/2022  Patient will be independent with initial HEP  Baseline: Goal status: IN PROGRESS  2.   Patient  will report at least 50% improvement in overall symptoms and/or function to demonstrate improved functional mobility  Baseline: 02/20/22 "50%" better Goal status: MET    LONG TERM GOALS: Target date: 02/26/2022   Patient will be independent in self management strategies to improve quality of life and functional outcomes.   Baseline:  Goal status: IN PROGRESS  2.   Patient will report at least 75% improvement in overall symptoms and/or function to demonstrate improved functional mobility  Baseline:  Goal status: IN PROGRESS  3.  Patient will increase left leg MMTs to 5/5 without pain to promote return to ambulation community distances with minimal deviation.  Baseline: see above Goal status: IN PROGRESS  4.  Patient will improve FOTO score to predicted value  Baseline: 58 Goal status: IN PROGRESS  5.  Patient will improve 5 times sit to stand score from 12 sec to 10 sec to demonstrate improved functional mobility and increased lower extremity strength.   Baseline:  Goal status: IN PROGRESS    PLAN:  PT FREQUENCY: 1-2x/week  PT DURATION: 3 weeks  Therapeutic exercises, Therapeutic activity, Neuromuscular re-education, Balance training, Gait training, Patient/Family education, Joint manipulation, Joint mobilization, Stair training, Orthotic/Fit training, DME instructions, Aquatic Therapy, Dry Needling, Electrical stimulation, Spinal manipulation, Spinal mobilization, Cryotherapy, Moist heat, Compression bandaging, scar mobilization, Splintting, Taping, Traction, Ultrasound, Ionotophoresis 39m/ml Dexamethasone, and Manual therapy  PLAN FOR NEXT SESSION:  progress hip  strengthening and pain management as able. Re-check strength   8:15 AM, 02/20/22 Adelynn Gipe Small Boe Deans MPT [facility] [place] [phone]

## 2022-02-25 ENCOUNTER — Encounter (HOSPITAL_COMMUNITY): Payer: 59

## 2022-02-28 ENCOUNTER — Ambulatory Visit (INDEPENDENT_AMBULATORY_CARE_PROVIDER_SITE_OTHER): Payer: 59

## 2022-02-28 ENCOUNTER — Encounter (HOSPITAL_COMMUNITY): Payer: 59

## 2022-02-28 ENCOUNTER — Other Ambulatory Visit (HOSPITAL_COMMUNITY): Payer: Self-pay

## 2022-02-28 DIAGNOSIS — J309 Allergic rhinitis, unspecified: Secondary | ICD-10-CM

## 2022-03-03 ENCOUNTER — Other Ambulatory Visit (HOSPITAL_COMMUNITY): Payer: Self-pay

## 2022-03-04 ENCOUNTER — Telehealth: Payer: 59 | Admitting: Physician Assistant

## 2022-03-04 ENCOUNTER — Encounter (HOSPITAL_COMMUNITY): Payer: 59

## 2022-03-04 DIAGNOSIS — J208 Acute bronchitis due to other specified organisms: Secondary | ICD-10-CM | POA: Diagnosis not present

## 2022-03-04 DIAGNOSIS — B9689 Other specified bacterial agents as the cause of diseases classified elsewhere: Secondary | ICD-10-CM

## 2022-03-04 MED ORDER — BENZONATATE 100 MG PO CAPS: 100.0000 mg | ORAL_CAPSULE | Freq: Three times a day (TID) | ORAL | 0 refills | Status: DC | PRN

## 2022-03-04 MED ORDER — AZITHROMYCIN 250 MG PO TABS: ORAL_TABLET | ORAL | 0 refills | Status: AC

## 2022-03-04 NOTE — Progress Notes (Signed)
We are sorry that you are not feeling well.  Here is how we plan to help!  Based on your presentation I believe you most likely have A cough due to bacteria.  When patients have a productive cough with a change in color or increased sputum production, we are concerned about bacterial bronchitis.  If left untreated it can progress to pneumonia.  If your symptoms do not improve with your treatment plan it is important that you contact your provider.   I have prescribed Azithromyin 250 mg: two tablets now and then one tablet daily for 4 additonal days    In addition you may use A prescription cough medication called Tessalon Perles '100mg'$ . You may take 1-2 capsules every 8 hours as needed for your cough.   From your responses in the eVisit questionnaire you describe inflammation in the upper respiratory tract which is causing a significant cough.  This is commonly called Bronchitis and has four common causes:   Allergies Viral Infections Acid Reflux Bacterial Infection Allergies, viruses and acid reflux are treated by controlling symptoms or eliminating the cause. An example might be a cough caused by taking certain blood pressure medications. You stop the cough by changing the medication. Another example might be a cough caused by acid reflux. Controlling the reflux helps control the cough.  USE OF BRONCHODILATOR ("RESCUE") INHALERS: There is a risk from using your bronchodilator too frequently.  The risk is that over-reliance on a medication which only relaxes the muscles surrounding the breathing tubes can reduce the effectiveness of medications prescribed to reduce swelling and congestion of the tubes themselves.  Although you feel brief relief from the bronchodilator inhaler, your asthma may actually be worsening with the tubes becoming more swollen and filled with mucus.  This can delay other crucial treatments, such as oral steroid medications. If you need to use a bronchodilator inhaler daily,  several times per day, you should discuss this with your provider.  There are probably better treatments that could be used to keep your asthma under control.     HOME CARE Only take medications as instructed by your medical team. Complete the entire course of an antibiotic. Drink plenty of fluids and get plenty of rest. Avoid close contacts especially the very young and the elderly Cover your mouth if you cough or cough into your sleeve. Always remember to wash your hands A steam or ultrasonic humidifier can help congestion.   GET HELP RIGHT AWAY IF: You develop worsening fever. You become short of breath You cough up blood. Your symptoms persist after you have completed your treatment plan MAKE SURE YOU  Understand these instructions. Will watch your condition. Will get help right away if you are not doing well or get worse.    Thank you for choosing an e-visit.  Your e-visit answers were reviewed by a board certified advanced clinical practitioner to complete your personal care plan. Depending upon the condition, your plan could have included both over the counter or prescription medications.  Please review your pharmacy choice. Make sure the pharmacy is open so you can pick up prescription now. If there is a problem, you may contact your provider through [employer] and have the prescription routed to another pharmacy.  Your safety is important to [place]. If you have drug allergies check your prescription carefully.   For the next 24 hours you can use MyChart to ask questions about today's visit, request a non-urgent call back, or ask for a work  or school excuse. You will get an email in the next two days asking about your experience. I hope that your e-visit has been valuable and will speed your recovery.

## 2022-03-04 NOTE — Progress Notes (Signed)
I have spent 5 minutes in review of e-visit questionnaire, review and updating patient chart, medical decision making and response to patient.   Cap Massi Cody Artin Mceuen, PA-C    

## 2022-03-05 ENCOUNTER — Other Ambulatory Visit (HOSPITAL_COMMUNITY): Payer: Self-pay

## 2022-03-06 ENCOUNTER — Encounter (HOSPITAL_COMMUNITY): Payer: 59

## 2022-03-06 DIAGNOSIS — J069 Acute upper respiratory infection, unspecified: Secondary | ICD-10-CM | POA: Diagnosis not present

## 2022-03-06 DIAGNOSIS — E663 Overweight: Secondary | ICD-10-CM | POA: Diagnosis not present

## 2022-03-06 DIAGNOSIS — Z6825 Body mass index (BMI) 25.0-25.9, adult: Secondary | ICD-10-CM | POA: Diagnosis not present

## 2022-03-21 ENCOUNTER — Other Ambulatory Visit (HOSPITAL_COMMUNITY): Payer: Self-pay

## 2022-03-31 ENCOUNTER — Other Ambulatory Visit (HOSPITAL_COMMUNITY): Payer: Self-pay

## 2022-03-31 ENCOUNTER — Other Ambulatory Visit: Payer: Self-pay

## 2022-04-01 ENCOUNTER — Encounter: Payer: Self-pay | Admitting: Family Medicine

## 2022-04-01 ENCOUNTER — Other Ambulatory Visit: Payer: Self-pay

## 2022-04-02 ENCOUNTER — Other Ambulatory Visit: Payer: Self-pay | Admitting: Family Medicine

## 2022-04-02 ENCOUNTER — Other Ambulatory Visit (HOSPITAL_COMMUNITY): Payer: Self-pay

## 2022-04-02 ENCOUNTER — Other Ambulatory Visit: Payer: Self-pay

## 2022-04-02 MED ORDER — VALACYCLOVIR HCL 1 G PO TABS
1000.0000 mg | ORAL_TABLET | Freq: Every day | ORAL | 3 refills | Status: DC
  Filled 2022-04-02 – 2022-04-21 (×2): qty 90, 90d supply, fill #0
  Filled 2022-07-02 (×2): qty 90, 90d supply, fill #1
  Filled 2022-09-22: qty 90, 90d supply, fill #2
  Filled 2022-09-29: qty 90, 90d supply, fill #0
  Filled 2022-09-29: qty 90, 90d supply, fill #2
  Filled 2022-12-24 – 2023-01-06 (×2): qty 90, 90d supply, fill #1

## 2022-04-02 NOTE — Telephone Encounter (Signed)
Hi Not sure why there was just 22 pills Our electronic record shows 30 pills at a time The new prescription sent in was for 90 pills 1 daily with 3 refills this should cover this issue for quite some time  If any additional questions concerns or issues please let us know thanks-Dr. Wolfgang Phoenix

## 2022-04-21 ENCOUNTER — Other Ambulatory Visit (HOSPITAL_COMMUNITY): Payer: Self-pay

## 2022-04-24 ENCOUNTER — Other Ambulatory Visit (HOSPITAL_COMMUNITY): Payer: Self-pay

## 2022-04-24 ENCOUNTER — Other Ambulatory Visit: Payer: Self-pay

## 2022-05-19 ENCOUNTER — Telehealth: Payer: Self-pay

## 2022-05-19 ENCOUNTER — Telehealth: Payer: Self-pay | Admitting: Family Medicine

## 2022-05-19 ENCOUNTER — Other Ambulatory Visit: Payer: Self-pay

## 2022-05-19 ENCOUNTER — Other Ambulatory Visit (HOSPITAL_COMMUNITY): Payer: Self-pay

## 2022-05-19 ENCOUNTER — Other Ambulatory Visit: Payer: Self-pay | Admitting: Family Medicine

## 2022-05-19 DIAGNOSIS — Z131 Encounter for screening for diabetes mellitus: Secondary | ICD-10-CM

## 2022-05-19 DIAGNOSIS — Z130 Encounter for screening for diseases of the blood and blood-forming organs and certain disorders involving the immune mechanism: Secondary | ICD-10-CM

## 2022-05-19 DIAGNOSIS — Z13220 Encounter for screening for lipoid disorders: Secondary | ICD-10-CM

## 2022-05-19 NOTE — Telephone Encounter (Signed)
Last labs 12/2020

## 2022-05-19 NOTE — Telephone Encounter (Signed)
Blood work ordered in EPIC.    Left message to return call 

## 2022-05-19 NOTE — Telephone Encounter (Signed)
Patient called back and notified that lab work is ordered per Dr Nicki Reaper. Patient verbalized understanding.

## 2022-05-19 NOTE — Telephone Encounter (Signed)
Patient has physical in April and needing labs done.

## 2022-05-19 NOTE — Telephone Encounter (Signed)
CBC, CMP, lipid Wellness

## 2022-05-20 ENCOUNTER — Other Ambulatory Visit: Payer: Self-pay

## 2022-05-20 ENCOUNTER — Other Ambulatory Visit (HOSPITAL_COMMUNITY): Payer: Self-pay

## 2022-05-20 MED ORDER — VALACYCLOVIR HCL 1 G PO TABS
ORAL_TABLET | ORAL | 6 refills | Status: DC
  Filled 2022-05-20 – 2022-05-21 (×2): qty 4, 1d supply, fill #0
  Filled 2022-06-10 (×2): qty 4, 1d supply, fill #1
  Filled 2022-07-07: qty 4, 1d supply, fill #2

## 2022-05-21 ENCOUNTER — Other Ambulatory Visit (HOSPITAL_COMMUNITY): Payer: Self-pay

## 2022-05-21 ENCOUNTER — Other Ambulatory Visit: Payer: Self-pay

## 2022-05-22 ENCOUNTER — Other Ambulatory Visit (HOSPITAL_COMMUNITY): Payer: Self-pay

## 2022-05-22 ENCOUNTER — Encounter: Payer: Self-pay | Admitting: Radiology

## 2022-05-23 ENCOUNTER — Other Ambulatory Visit (HOSPITAL_COMMUNITY): Payer: Self-pay

## 2022-06-02 ENCOUNTER — Telehealth: Payer: Self-pay

## 2022-06-02 NOTE — Telephone Encounter (Signed)
Called and spoke to patient and informed her that her allergen vials have now expired. Patient expressed that she wanted to stop immunotherapy as she could not make within our hours anymore. Patient was mailed the discontinuation form to sign and mail back into our office. Patient verbalized understanding. Patient was also advised that our [facility] office has late night clinics as well.

## 2022-06-05 NOTE — Telephone Encounter (Signed)
I called Veronica Allen. She works at [employer]. She said that there are colleagues who would be willing to give her the injections. I emailed her and copied [place] so we could get all of the forms together.   Salvatore Marvel, MD [facility] [place] [facility] [place]

## 2022-06-05 NOTE — Telephone Encounter (Signed)
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 Is there someone there that can give them? >

## 2022-06-06 NOTE — Telephone Encounter (Signed)
I am sure she could come in to restart and then take vials with her.   Salvatore Marvel, MD [facility] [place] [facility] [place]

## 2022-06-10 ENCOUNTER — Other Ambulatory Visit (HOSPITAL_COMMUNITY): Payer: Self-pay

## 2022-06-10 MED ORDER — EPINEPHRINE 0.3 MG/0.3ML IJ SOAJ
0.3000 mg | INTRAMUSCULAR | 1 refills | Status: DC
  Filled 2022-06-10: qty 2, 7d supply, fill #0
  Filled 2022-06-10: qty 2, 2d supply, fill #0
  Filled 2022-06-10: qty 2, 7d supply, fill #0

## 2022-06-10 NOTE — Addendum Note (Signed)
Addended by: Herbie Drape on: 06/10/2022 10:05 AM   Modules accepted: Orders

## 2022-06-10 NOTE — Telephone Encounter (Addendum)
Patient was returning Cree's call. I have scheduled patient to receive her first injection out of the office and take them to her place of employment. Patient stated that she will need a refill of her Epi pen. Script has been sent to the pharmacy.

## 2022-06-13 NOTE — Telephone Encounter (Signed)
Let's start at the Green Vial 0.025 mL.   Thanks!   Salvatore Marvel, MD [facility] [place] [facility] [place]

## 2022-06-13 NOTE — Telephone Encounter (Signed)
Veronica Allen has signed the vial transfer papers and I have faxed them over to [place] at her personal fax number [phone] to be signed by the attending provider there and faxed back to our office.

## 2022-06-13 NOTE — Telephone Encounter (Signed)
As I was going through the vials, I ran across patients vials that are Red and they expire 01/10/2023. I can mix them down. Where would you like her to resume at as she last got 0.4 of her red on schedule B on December 8th, 2023.

## 2022-06-20 ENCOUNTER — Other Ambulatory Visit (HOSPITAL_COMMUNITY): Payer: Self-pay

## 2022-06-20 ENCOUNTER — Other Ambulatory Visit: Payer: Self-pay | Admitting: Obstetrics & Gynecology

## 2022-06-24 ENCOUNTER — Encounter: Payer: Self-pay | Admitting: Obstetrics & Gynecology

## 2022-06-25 ENCOUNTER — Other Ambulatory Visit (HOSPITAL_COMMUNITY): Payer: Self-pay

## 2022-06-25 MED ORDER — DROSPIRENONE-ESTETROL 3-14.2 MG PO TABS
1.0000 | ORAL_TABLET | Freq: Every day | ORAL | 12 refills | Status: DC
  Filled 2022-06-25: qty 28, 28d supply, fill #0

## 2022-07-02 ENCOUNTER — Other Ambulatory Visit: Payer: Self-pay

## 2022-07-03 ENCOUNTER — Other Ambulatory Visit: Payer: Self-pay

## 2022-07-04 ENCOUNTER — Ambulatory Visit (INDEPENDENT_AMBULATORY_CARE_PROVIDER_SITE_OTHER): Payer: 59

## 2022-07-04 DIAGNOSIS — Z131 Encounter for screening for diabetes mellitus: Secondary | ICD-10-CM | POA: Diagnosis not present

## 2022-07-04 DIAGNOSIS — Z130 Encounter for screening for diseases of the blood and blood-forming organs and certain disorders involving the immune mechanism: Secondary | ICD-10-CM | POA: Diagnosis not present

## 2022-07-04 DIAGNOSIS — J309 Allergic rhinitis, unspecified: Secondary | ICD-10-CM | POA: Diagnosis not present

## 2022-07-04 DIAGNOSIS — Z13220 Encounter for screening for lipoid disorders: Secondary | ICD-10-CM | POA: Diagnosis not present

## 2022-07-04 NOTE — Progress Notes (Signed)
Immunotherapy   Patient Details  Name: Rakyia Mullenix MRN: [id number] Date of Birth: [birth year]  07/04/2022  Arvil Chaco here to pick up  her vials for grasses, weeds, ragweed's, molds, dust mites, cockroaches  Following schedule: B  Frequency:1 time per week Epi-Pen:Epi-Pen Available  Consent signed and patient instructions given. Patient was sent with her vials and her paperwork to continue her immunotherapy at her job.    Ralene Muskrat 07/04/2022, 3:10 PM

## 2022-07-05 LAB — CBC WITH DIFFERENTIAL/PLATELET
Basophils Absolute: 0.1 10*3/uL (ref 0.0–0.2)
Basos: 1 %
EOS (ABSOLUTE): 0.2 10*3/uL (ref 0.0–0.4)
Eos: 3 %
Hematocrit: 38.7 % (ref 34.0–46.6)
Hemoglobin: 13.1 g/dL (ref 11.1–15.9)
Immature Grans (Abs): 0 10*3/uL (ref 0.0–0.1)
Immature Granulocytes: 0 %
Lymphocytes Absolute: 3.1 10*3/uL (ref 0.7–3.1)
Lymphs: 40 %
MCH: 29.8 pg (ref 26.6–33.0)
MCHC: 33.9 g/dL (ref 31.5–35.7)
MCV: 88 fL (ref 79–97)
Monocytes Absolute: 0.8 10*3/uL (ref 0.1–0.9)
Monocytes: 10 %
Neutrophils Absolute: 3.5 10*3/uL (ref 1.4–7.0)
Neutrophils: 46 %
Platelets: 388 10*3/uL (ref 150–450)
RBC: 4.4 x10E6/uL (ref 3.77–5.28)
RDW: 11.6 % — ABNORMAL LOW (ref 11.7–15.4)
WBC: 7.8 10*3/uL (ref 3.4–10.8)

## 2022-07-05 LAB — CMP14+EGFR
ALT: 17 IU/L (ref 0–32)
AST: 17 IU/L (ref 0–40)
Albumin/Globulin Ratio: 1.7 (ref 1.2–2.2)
Albumin: 4.6 g/dL (ref 4.0–5.0)
Alkaline Phosphatase: 72 IU/L (ref 44–121)
BUN/Creatinine Ratio: 12 (ref 9–23)
BUN: 9 mg/dL (ref 6–20)
Bilirubin Total: 0.2 mg/dL (ref 0.0–1.2)
CO2: 25 mmol/L (ref 20–29)
Calcium: 9.7 mg/dL (ref 8.7–10.2)
Chloride: 101 mmol/L (ref 96–106)
Creatinine, Ser: 0.77 mg/dL (ref 0.57–1.00)
Globulin, Total: 2.7 g/dL (ref 1.5–4.5)
Glucose: 85 mg/dL (ref 70–99)
Potassium: 4.5 mmol/L (ref 3.5–5.2)
Sodium: 140 mmol/L (ref 134–144)
Total Protein: 7.3 g/dL (ref 6.0–8.5)
eGFR: 108 mL/min/{1.73_m2} (ref >59)

## 2022-07-05 LAB — LIPID PANEL
Chol/HDL Ratio: 3.6 ratio (ref 0.0–4.4)
Cholesterol, Total: 246 mg/dL — ABNORMAL HIGH (ref 100–199)
HDL: 68 mg/dL (ref >39)
LDL Chol Calc (NIH): 160 mg/dL — ABNORMAL HIGH (ref 0–99)
Triglycerides: 101 mg/dL (ref 0–149)
VLDL Cholesterol Cal: 18 mg/dL (ref 5–40)

## 2022-07-07 ENCOUNTER — Ambulatory Visit: Payer: 59 | Admitting: Family Medicine

## 2022-07-07 ENCOUNTER — Other Ambulatory Visit (HOSPITAL_COMMUNITY): Payer: Self-pay

## 2022-07-07 ENCOUNTER — Encounter: Payer: Self-pay | Admitting: Family Medicine

## 2022-07-07 VITALS — BP 111/74 | HR 92 | Ht 63.5 in | Wt 140.0 lb

## 2022-07-07 DIAGNOSIS — Z0001 Encounter for general adult medical examination with abnormal findings: Secondary | ICD-10-CM

## 2022-07-07 DIAGNOSIS — R010 Benign and innocent cardiac murmurs: Secondary | ICD-10-CM | POA: Diagnosis not present

## 2022-07-07 DIAGNOSIS — F419 Anxiety disorder, unspecified: Secondary | ICD-10-CM

## 2022-07-07 DIAGNOSIS — Z0000 Encounter for general adult medical examination without abnormal findings: Secondary | ICD-10-CM

## 2022-07-07 MED ORDER — NEXTSTELLIS 3-14.2 MG PO TABS
1.0000 | ORAL_TABLET | Freq: Every day | ORAL | 12 refills | Status: DC
  Filled 2022-07-07: qty 28, 28d supply, fill #0

## 2022-07-07 MED ORDER — VALACYCLOVIR HCL 1 G PO TABS: ORAL_TABLET | ORAL | 6 refills | Status: DC

## 2022-07-07 NOTE — Progress Notes (Signed)
   Subjective:    Patient ID: Arvil Chaco, female    DOB: [birth year], 27 y.o.   MRN: [id number]  HPI The patient comes in today for a wellness visit.  She is having a lot of stress recently husband went through serious sickness now he is doing better after neck surgery plus also she had a falling out with her mother So far mother not communicating with her any longer patient denies being depressed does state at times anxious  A review of their health history was completed.  A review of medications was also completed.  Any needed refills; No  Eating habits: trying to eat healthy  Falls/  MVA accidents in past few months: no  Regular exercise: yes walking daily  Specialist pt sees on regular basis: Gynecologist (Dr Despina Hidden), Allergy and Asthma  Preventative health issues were discussed.   Additional concerns: concerned anxiety   Denies being depressed Recently her husband went through cervical neck surgery and unable to work for several months and also she has had a stressful issue with her mother and therefore not communicating well currently   Review of Systems     Objective:   Physical Exam General-in no acute distress Eyes-no discharge Lungs-respiratory rate normal, CTA CV-faint murmur,RRR Extremities skin warm dry no edema Neuro grossly normal Behavior normal, alert  Patient does use occasional soda with caffeine but denies any drug use smoking etc.      Assessment & Plan:  1. Well adult exam Adult wellness-complete.wellness physical was conducted today. Importance of diet and exercise were discussed in detail.  Importance of stress reduction and healthy living were discussed.  In addition to this a discussion regarding safety was also covered.  We also reviewed over immunizations and gave recommendations regarding current immunization needed for age.   In addition to this additional areas were also touched on including: Preventative health exams  needed:  Colonoscopy not indicated currently  Patient was advised yearly wellness exam   2. Benign heart murmur Echo ordered more than likely will be normal may get cardiology consult if abnormal  3. Anxiety Patient under a fair amount of stress gets anxious finds himself worrying a lot we did discuss various options including counseling versus medication she chooses counseling we will have her go ahead through employee assistance program

## 2022-07-08 ENCOUNTER — Other Ambulatory Visit: Payer: Self-pay

## 2022-07-08 ENCOUNTER — Other Ambulatory Visit (HOSPITAL_COMMUNITY): Payer: Self-pay

## 2022-07-14 ENCOUNTER — Encounter: Payer: Self-pay | Admitting: Family Medicine

## 2022-07-14 DIAGNOSIS — R010 Benign and innocent cardiac murmurs: Secondary | ICD-10-CM

## 2022-07-14 NOTE — Telephone Encounter (Signed)
Nurses-please order echo-benign murmur or flow murmur as the diagnosis thank you

## 2022-07-17 ENCOUNTER — Other Ambulatory Visit (HOSPITAL_COMMUNITY): Payer: Self-pay

## 2022-07-17 NOTE — Addendum Note (Signed)
Addended by: Margaretha Sheffield on: 07/17/2022 08:50 AM   Modules accepted: Orders

## 2022-08-04 ENCOUNTER — Telehealth: Payer: Self-pay

## 2022-08-04 ENCOUNTER — Other Ambulatory Visit (HOSPITAL_COMMUNITY): Payer: Self-pay

## 2022-08-04 MED ORDER — CARBINOXAMINE MALEATE 4 MG PO TABS
4.0000 mg | ORAL_TABLET | Freq: Three times a day (TID) | ORAL | 1 refills | Status: DC | PRN
  Filled 2022-08-04 – 2022-08-05 (×2): qty 270, 90d supply, fill #0

## 2022-08-04 NOTE — Telephone Encounter (Signed)
Patient contacted our office to request a refill on [facility].

## 2022-08-05 ENCOUNTER — Other Ambulatory Visit (HOSPITAL_COMMUNITY): Payer: Self-pay

## 2022-08-12 ENCOUNTER — Ambulatory Visit (HOSPITAL_COMMUNITY)
Admission: RE | Admit: 2022-08-12 | Discharge: 2022-08-12 | Disposition: A | Discharge status: Home / Self Care | Payer: 59 | Source: Ambulatory Visit | Attending: Family Medicine | Admitting: Family Medicine

## 2022-08-12 DIAGNOSIS — R010 Benign and innocent cardiac murmurs: Secondary | ICD-10-CM | POA: Diagnosis not present

## 2022-08-12 LAB — ECHOCARDIOGRAM COMPLETE
Area-P 1/2: 4.21 cm2
S' Lateral: 2.3 cm

## 2022-08-12 NOTE — Progress Notes (Signed)
*  PRELIMINARY RESULTS* Echocardiogram 2D Echocardiogram has been performed.  Stacey Drain 08/12/2022, 12:26 PM

## 2022-08-13 ENCOUNTER — Encounter: Payer: Self-pay | Admitting: Family Medicine

## 2022-08-13 DIAGNOSIS — I340 Nonrheumatic mitral (valve) insufficiency: Secondary | ICD-10-CM | POA: Insufficient documentation

## 2022-09-22 ENCOUNTER — Other Ambulatory Visit: Payer: Self-pay | Admitting: Family Medicine

## 2022-09-22 ENCOUNTER — Other Ambulatory Visit (HOSPITAL_COMMUNITY): Payer: Self-pay

## 2022-09-23 ENCOUNTER — Other Ambulatory Visit (HOSPITAL_COMMUNITY): Payer: Self-pay

## 2022-09-23 MED ORDER — VALACYCLOVIR HCL 1 G PO TABS
ORAL_TABLET | ORAL | 6 refills | Status: DC
  Filled 2022-09-23: qty 4, 2d supply, fill #0
  Filled 2022-09-29: qty 12, 3d supply, fill #0

## 2022-09-24 ENCOUNTER — Other Ambulatory Visit (HOSPITAL_COMMUNITY): Payer: Self-pay

## 2022-09-29 ENCOUNTER — Other Ambulatory Visit (HOSPITAL_COMMUNITY): Payer: Self-pay

## 2022-09-30 ENCOUNTER — Other Ambulatory Visit (HOSPITAL_COMMUNITY): Payer: Self-pay

## 2022-09-30 ENCOUNTER — Other Ambulatory Visit: Payer: Self-pay

## 2022-11-11 ENCOUNTER — Encounter: Payer: Self-pay | Admitting: Obstetrics & Gynecology

## 2022-11-11 ENCOUNTER — Other Ambulatory Visit (HOSPITAL_COMMUNITY)
Admission: RE | Admit: 2022-11-11 | Discharge: 2022-11-11 | Disposition: A | Discharge status: Home / Self Care | Payer: 59 | Source: Ambulatory Visit | Attending: Obstetrics & Gynecology | Admitting: Obstetrics & Gynecology

## 2022-11-11 ENCOUNTER — Ambulatory Visit (INDEPENDENT_AMBULATORY_CARE_PROVIDER_SITE_OTHER): Payer: 59 | Admitting: Obstetrics & Gynecology

## 2022-11-11 VITALS — BP 130/82 | HR 87 | Ht 63.0 in | Wt 152.0 lb

## 2022-11-11 DIAGNOSIS — Z01419 Encounter for gynecological examination (general) (routine) without abnormal findings: Secondary | ICD-10-CM | POA: Diagnosis not present

## 2022-11-11 DIAGNOSIS — N926 Irregular menstruation, unspecified: Secondary | ICD-10-CM | POA: Diagnosis not present

## 2022-11-11 LAB — POCT URINE PREGNANCY: Preg Test, Ur: POSITIVE — AB

## 2022-11-11 NOTE — Progress Notes (Signed)
Subjective:     Veronica Allen is a 27 y.o. female here for a routine exam.  Patient's last menstrual period was 10/08/2022. G1P0000 Birth Control Method:  none + UPT today Menstrual Calendar(currently): regular  Current complaints: none.   Current acute medical issues:  none   Recent Gynecologic History Patient's last menstrual period was 10/08/2022. Last Pap: 10/2019,  normal Last mammogram  Past Medical History:  Diagnosis Date   Heart murmur    Migraine 05/01/2020    Past Surgical History:  Procedure Laterality Date   WISDOM TOOTH EXTRACTION  06/2015    OB History     Gravida  1   Para  0   Term  0   Preterm  0   AB  0   Living  0      SAB  0   IAB  0   Ectopic  0   Multiple  0   Live Births  0           Social History   Socioeconomic History   Marital status: Married    Spouse name: Not on file   Number of children: Not on file   Years of education: Not on file   Highest education level: Not on file  Occupational History   Not on file  Tobacco Use   Smoking status: Never   Smokeless tobacco: Never  Vaping Use   Vaping status: Never Used  Substance and Sexual Activity   Alcohol use: Yes    Comment: social   Drug use: No   Sexual activity: Yes  Other Topics Concern   Not on file  Social History Narrative   Not on file   Social Determinants of Health   Financial Resource Strain: Low Risk  (11/07/2019)   Overall Financial Resource Strain (CARDIA)    Difficulty of Paying Living Expenses: Not hard at all  Food Insecurity: No Food Insecurity (11/07/2019)   Hunger Vital Sign    Worried About Running Out of Food in the Last Year: Never true    Ran Out of Food in the Last Year: Never true  Transportation Needs: No Transportation Needs (11/07/2019)   PRAPARE - Administrator, Civil Service (Medical): No    Lack of Transportation (Non-Medical): No  Physical Activity: Sufficiently Active (11/07/2019)   Exercise Vital  Sign    Days of Exercise per Week: 7 days    Minutes of Exercise per Session: 60 min  Stress: No Stress Concern Present (11/07/2019)   [employer] of Occupational Health - Occupational Stress Questionnaire    Feeling of Stress : Only a little  Social Connections: Moderately Isolated (11/07/2019)   Social Connection and Isolation Panel [NHANES]    Frequency of Communication with Friends and Family: More than three times a week    Frequency of Social Gatherings with Friends and Family: Twice a week    Attends Religious Services: Never    Database administrator or Organizations: No    Attends Engineer, structural: Never    Marital Status: Living with partner    Family History  Problem Relation Age of Onset   Asthma Mother    Asthma Sister    Allergic rhinitis Sister    Eczema Sister    Cancer Maternal Grandfather        lung   Diabetes Paternal Grandmother    Diabetes Paternal Grandfather      Current Outpatient Medications:    Carbinoxamine  Maleate 4 MG TABS, Take 1 tablet (4 mg total) by mouth every 8 (eight) hours as needed., Disp: 360 tablet, Rfl: 1   EPINEPHrine (EPIPEN 2-PAK) 0.3 mg/0.3 mL IJ SOAJ injection, Inject 0.3 mg into the muscle once for 1 dose as directed, Disp: 2 each, Rfl: 1   Prenatal MV & Min w/FA-DHA (PRENATAL GUMMIES PO), Take by mouth., Disp: , Rfl:    valACYclovir (VALTREX) 1000 MG tablet, Take 1 tablet (1,000 mg total) by mouth daily., Disp: 90 tablet, Rfl: 3   SUMAtriptan (IMITREX) 50 MG tablet, TAKE 1 TABLET BY MOUTH EVERY 2 HOURS AS NEEDED FOR MIGRAINE OR HEADACHE. MAY REPEAT IN 2 HOURS IF HEADACHE PERSISTS OR RECURS. MAX 2 TABLETS PER DAY., Disp: 10 tablet, Rfl: 1   valACYclovir (VALTREX) 1000 MG tablet, Take 2 tablet by mouth now and then 2 tablets by mouth 12 hours later (Patient not taking: Reported on 11/11/2022), Disp: 8 tablet, Rfl: 6   valACYclovir (VALTREX) 1000 MG tablet, Take 2 tablets by mouth now and then 2 tablets by mouth 12  hours later (Patient not taking: Reported on 11/11/2022), Disp: 4 tablet, Rfl: 6  Review of Systems  Review of Systems  Constitutional: Negative for fever, chills, weight loss, malaise/fatigue and diaphoresis.  HENT: Negative for hearing loss, ear pain, nosebleeds, congestion, sore throat, neck pain, tinnitus and ear discharge.   Eyes: Negative for blurred vision, double vision, photophobia, pain, discharge and redness.  Respiratory: Negative for cough, hemoptysis, sputum production, shortness of breath, wheezing and stridor.   Cardiovascular: Negative for chest pain, palpitations, orthopnea, claudication, leg swelling and PND.  Gastrointestinal: negative for abdominal pain. Negative for heartburn, nausea, vomiting, diarrhea, constipation, blood in stool and melena.  Genitourinary: Negative for dysuria, urgency, frequency, hematuria and flank pain.  Musculoskeletal: Negative for myalgias, Allen pain, joint pain and falls.  Skin: Negative for itching and rash.  Neurological: Negative for dizziness, tingling, tremors, sensory change, speech change, focal weakness, seizures, loss of consciousness, weakness and headaches.  Endo/Heme/Allergies: Negative for environmental allergies and polydipsia. Does not bruise/bleed easily.  Psychiatric/Behavioral: Negative for depression, suicidal ideas, hallucinations, memory loss and substance abuse. The patient is not nervous/anxious and does not have insomnia.        Objective:  Blood pressure 130/82, pulse 87, height 5\' 3"  (1.6 m), weight 152 lb (68.9 kg), last menstrual period 10/08/2022.   Physical Exam  Vitals reviewed. Constitutional: She is oriented to person, place, and time. She appears well-developed and well-nourished.  HENT:  Head: Normocephalic and atraumatic.        Right Ear: External ear normal.  Left Ear: External ear normal.  Nose: Nose normal.  Mouth/Throat: Oropharynx is clear and moist.  Eyes: Conjunctivae and EOM are normal. Pupils  are equal, round, and reactive to light. Right eye exhibits no discharge. Left eye exhibits no discharge. No scleral icterus.  Neck: Normal range of motion. Neck supple. No tracheal deviation present. No thyromegaly present.  Cardiovascular: Normal rate, regular rhythm, normal heart sounds and intact distal pulses.  Exam reveals no gallop and no friction rub.   No murmur heard. Respiratory: Effort normal and breath sounds normal. No respiratory distress. She has no wheezes. She has no rales. She exhibits no tenderness.  GI: Soft. Bowel sounds are normal. She exhibits no distension and no mass. There is no tenderness. There is no rebound and no guarding.  Genitourinary:  Breasts no masses skin changes or nipple changes bilaterally      Vulva is  normal without lesions Vagina is pink moist without discharge Cervix normal in appearance and pap is done Uterus is normal size shape and contour Adnexa is negative with normal sized ovaries   Musculoskeletal: Normal range of motion. She exhibits no edema and no tenderness.  Neurological: She is alert and oriented to person, place, and time. She has normal reflexes. She displays normal reflexes. No cranial nerve deficit. She exhibits normal muscle tone. Coordination normal.  Skin: Skin is warm and dry. No rash noted. No erythema. No pallor.  Psychiatric: She has a normal mood and affect. Her behavior is normal. Judgment and thought content normal.       Medications Ordered at today's visit: No orders of the defined types were placed in this encounter.   Other orders placed at today's visit: Orders Placed This Encounter  Procedures   POCT urine pregnancy      Assessment:    Normal Gyn exam.   +UPT today off pills x 3 months Plan:        Pap done, no brush Exam normal Return for 1st OB appt, sonogram 3 weeks.

## 2022-11-12 LAB — CYTOLOGY - PAP
Chlamydia: NEGATIVE
Comment: NEGATIVE
Comment: NEGATIVE
Comment: NORMAL
Diagnosis: NEGATIVE
High risk HPV: NEGATIVE
Neisseria Gonorrhea: NEGATIVE

## 2022-12-01 ENCOUNTER — Other Ambulatory Visit: Payer: Self-pay | Admitting: Obstetrics & Gynecology

## 2022-12-01 DIAGNOSIS — O3680X0 Pregnancy with inconclusive fetal viability, not applicable or unspecified: Secondary | ICD-10-CM

## 2022-12-02 ENCOUNTER — Ambulatory Visit (INDEPENDENT_AMBULATORY_CARE_PROVIDER_SITE_OTHER): Payer: 59

## 2022-12-02 DIAGNOSIS — O3680X0 Pregnancy with inconclusive fetal viability, not applicable or unspecified: Secondary | ICD-10-CM

## 2022-12-02 DIAGNOSIS — Z3481 Encounter for supervision of other normal pregnancy, first trimester: Secondary | ICD-10-CM | POA: Diagnosis not present

## 2022-12-02 DIAGNOSIS — Z3A01 Less than 8 weeks gestation of pregnancy: Secondary | ICD-10-CM

## 2022-12-02 NOTE — Progress Notes (Signed)
[place] 6+2 wks,single IUP with yolk sac,FHR 107 bpm,CRL 5.32 mm,normal ovaries

## 2022-12-05 ENCOUNTER — Encounter: Payer: Self-pay | Admitting: Obstetrics & Gynecology

## 2022-12-08 ENCOUNTER — Other Ambulatory Visit: Payer: Self-pay | Admitting: Adult Health

## 2022-12-08 MED ORDER — ONDANSETRON HCL 4 MG PO TABS
4.0000 mg | ORAL_TABLET | Freq: Three times a day (TID) | ORAL | 1 refills | Status: DC | PRN
  Filled 2022-12-08: qty 20, 7d supply, fill #0
  Filled 2022-12-24: qty 20, 7d supply, fill #1

## 2022-12-08 NOTE — Progress Notes (Signed)
Will rx zofran

## 2022-12-09 ENCOUNTER — Other Ambulatory Visit: Payer: Self-pay

## 2022-12-09 ENCOUNTER — Other Ambulatory Visit (HOSPITAL_COMMUNITY): Payer: Self-pay

## 2022-12-24 ENCOUNTER — Other Ambulatory Visit: Payer: Self-pay

## 2023-01-06 ENCOUNTER — Other Ambulatory Visit: Payer: Self-pay

## 2023-01-07 ENCOUNTER — Encounter: Payer: Self-pay | Admitting: Women's Health

## 2023-01-09 ENCOUNTER — Other Ambulatory Visit: Payer: Self-pay | Admitting: Obstetrics & Gynecology

## 2023-01-09 DIAGNOSIS — Z3682 Encounter for antenatal screening for nuchal translucency: Secondary | ICD-10-CM

## 2023-01-12 ENCOUNTER — Other Ambulatory Visit (HOSPITAL_COMMUNITY): Payer: Self-pay

## 2023-01-12 ENCOUNTER — Ambulatory Visit (INDEPENDENT_AMBULATORY_CARE_PROVIDER_SITE_OTHER): Payer: 59 | Admitting: Women's Health

## 2023-01-12 ENCOUNTER — Encounter: Payer: Self-pay | Admitting: Women's Health

## 2023-01-12 ENCOUNTER — Other Ambulatory Visit: Payer: Self-pay

## 2023-01-12 ENCOUNTER — Encounter: Payer: 59 | Admitting: *Deleted

## 2023-01-12 ENCOUNTER — Ambulatory Visit (INDEPENDENT_AMBULATORY_CARE_PROVIDER_SITE_OTHER): Payer: 59

## 2023-01-12 VITALS — BP 130/78 | HR 104 | Wt 145.0 lb

## 2023-01-12 DIAGNOSIS — R8271 Bacteriuria: Secondary | ICD-10-CM

## 2023-01-12 DIAGNOSIS — Z3A12 12 weeks gestation of pregnancy: Secondary | ICD-10-CM | POA: Diagnosis not present

## 2023-01-12 DIAGNOSIS — Z3401 Encounter for supervision of normal first pregnancy, first trimester: Secondary | ICD-10-CM | POA: Diagnosis not present

## 2023-01-12 DIAGNOSIS — O99891 Other specified diseases and conditions complicating pregnancy: Secondary | ICD-10-CM

## 2023-01-12 DIAGNOSIS — Z3682 Encounter for antenatal screening for nuchal translucency: Secondary | ICD-10-CM

## 2023-01-12 DIAGNOSIS — B001 Herpesviral vesicular dermatitis: Secondary | ICD-10-CM | POA: Insufficient documentation

## 2023-01-12 DIAGNOSIS — Z3400 Encounter for supervision of normal first pregnancy, unspecified trimester: Secondary | ICD-10-CM | POA: Insufficient documentation

## 2023-01-12 MED ORDER — ONDANSETRON HCL 4 MG PO TABS
4.0000 mg | ORAL_TABLET | Freq: Three times a day (TID) | ORAL | 6 refills | Status: DC | PRN
  Filled 2023-01-12: qty 20, 7d supply, fill #0
  Filled 2023-02-08: qty 20, 7d supply, fill #1
  Filled 2023-03-14: qty 20, 7d supply, fill #2
  Filled 2023-04-01: qty 20, 7d supply, fill #3

## 2023-01-12 NOTE — Addendum Note (Signed)
Addended by: Shawna Clamp R on: 01/12/2023 11:44 AM   Modules accepted: Level of Service

## 2023-01-12 NOTE — Patient Instructions (Signed)
Veronica Allen, thank you for choosing our office today! We appreciate the opportunity to meet your healthcare needs. You may receive a short survey by mail, e-mail, or through MyChart. If you are happy with your care we would appreciate if you could take just a few minutes to complete the survey questions. We read all of your comments and take your feedback very seriously. Thank you again for choosing our office.  [facility] ([address] [place], [place] [address]) Entrance C, located off of [address] Free 24/7 valet parking   Nausea & Vomiting  Have saltine crackers or pretzels by your bed and eat a few bites before you raise your head out of bed in the morning  Eat small frequent meals throughout the day instead of large meals  Drink plenty of fluids throughout the day to stay hydrated, just don't drink a lot of fluids with your meals.  This can make your stomach fill up faster making you feel sick  Do not brush your teeth right after you eat  Products with real ginger are good for nausea, like ginger ale and ginger hard candy Make sure it says made with real ginger!  Sucking on sour candy like lemon heads is also good for nausea  If your prenatal vitamins make you nauseated, take them at night so you will sleep through the nausea  Sea Bands  If you feel like you need medicine for the nausea & vomiting please let us know  If you are unable to keep any fluids or food down please let us know   Constipation  Drink plenty of fluid, preferably water, throughout the day  Eat foods high in fiber such as fruits, vegetables, and grains  Exercise, such as walking, is a good way to keep your bowels regular  Drink warm fluids, especially warm prune juice, or decaf coffee  Eat a 1/2 cup of real oatmeal (not instant), 1/2 cup applesauce, and 1/2-1 cup warm prune juice every day  If needed, you may take Colace  (docusate sodium) stool softener once or twice a day to help keep the stool soft.   If you still are having problems with constipation, you may take Miralax once daily as needed to help keep your bowels regular.   Home Blood Pressure Monitoring for Patients   Your provider has recommended that you check your blood pressure (BP) at least once a week at home. If you do not have a blood pressure cuff at home, one will be provided for you. Contact your provider if you have not received your monitor within 1 week.   Helpful Tips for Accurate Home Blood Pressure Checks  . Don't smoke, exercise, or drink caffeine 30 minutes before checking your BP . Use the restroom before checking your BP (a full bladder can raise your pressure) . Relax in a comfortable upright chair . Feet on the ground . Left arm resting comfortably on a flat surface at the level of your heart . Legs uncrossed . Back supported . Sit quietly and don't talk . Place the cuff on your bare arm . Adjust snuggly, so that only two fingertips can fit between your skin and the top of the cuff . Check 2 readings separated by at least one minute . Keep a log of your BP readings . For a visual, please reference this diagram: http://ccnc.care/bpdiagram  Provider Name: [facility]       Phone: [phone]  Zone 1: ALL CLEAR  Continue to monitor your symptoms:  . BP reading is less than 140 (top number) or less than 90 (bottom number)  . No right upper stomach pain . No headaches or seeing spots . No feeling nauseated or throwing up . No swelling in face and hands  Zone 2: CAUTION Call your doctor's office for any of the following:  . BP reading is greater than 140 (top number) or greater than 90 (bottom number)  . Stomach pain under your ribs in the middle or right side . Headaches or seeing spots . Feeling nauseated or throwing up . Swelling in face and hands  Zone 3: EMERGENCY  Seek immediate medical care if you have  any of the following:  . BP reading is greater than160 (top number) or greater than 110 (bottom number) . Severe headaches not improving with Tylenol . Serious difficulty catching your breath . Any worsening symptoms from Zone 2    First Trimester of Pregnancy The first trimester of pregnancy is from week 1 until the end of week 12 (months 1 through 3). A week after a sperm fertilizes an egg, the egg will implant on the wall of the uterus. This embryo will begin to develop into a baby. Genes from you and your partner are forming the baby. The female genes determine whether the baby is a boy or a girl. At 6-8 weeks, the eyes and face are formed, and the heartbeat can be seen on ultrasound. At the end of 12 weeks, all the baby's organs are formed.  Now that you are pregnant, you will want to do everything you can to have a healthy baby. Two of the most important things are to get good prenatal care and to follow your health care provider's instructions. Prenatal care is all the medical care you receive before the baby's birth. This care will help prevent, find, and treat any problems during the pregnancy and childbirth. BODY CHANGES Your body goes through many changes during pregnancy. The changes vary from woman to woman.   You may gain or lose a couple of pounds at first.  You may feel sick to your stomach (nauseous) and throw up (vomit). If the vomiting is uncontrollable, call your health care provider.  You may tire easily.  You may develop headaches that can be relieved by medicines approved by your health care provider.  You may urinate more often. Painful urination may mean you have a bladder infection.  You may develop heartburn as a result of your pregnancy.  You may develop constipation because certain hormones are causing the muscles that push waste through your intestines to slow down.  You may develop hemorrhoids or swollen, bulging veins (varicose veins).  Your breasts may begin  to grow larger and become tender. Your nipples may stick out more, and the tissue that surrounds them (areola) may become darker.  Your gums may bleed and may be sensitive to brushing and flossing.  Dark spots or blotches (chloasma, mask of pregnancy) may develop on your face. This will likely fade after the baby is born.  Your menstrual periods will stop.  You may have a loss of appetite.  You may develop cravings for certain kinds of food.  You may have changes in your emotions from day to day, such as being excited to be pregnant or being concerned that something may go wrong with the pregnancy and baby.  You may have more vivid and strange   dreams.  You may have changes in your hair. These can include thickening of your hair, rapid growth, and changes in texture. Some women also have hair loss during or after pregnancy, or hair that feels dry or thin. Your hair will most likely return to normal after your baby is born. WHAT TO EXPECT AT YOUR PRENATAL VISITS During a routine prenatal visit:  You will be weighed to make sure you and the baby are growing normally.  Your blood pressure will be taken.  Your abdomen will be measured to track your baby's growth.  The fetal heartbeat will be listened to starting around week 10 or 12 of your pregnancy.  Test results from any previous visits will be discussed. Your health care provider may ask you:  How you are feeling.  If you are feeling the baby move.  If you have had any abnormal symptoms, such as leaking fluid, bleeding, severe headaches, or abdominal cramping.  If you have any questions. Other tests that may be performed during your first trimester include:  Blood tests to find your blood type and to check for the presence of any previous infections. They will also be used to check for low iron levels (anemia) and Rh antibodies. Later in the pregnancy, blood tests for diabetes will be done along with other tests if problems  develop.  Urine tests to check for infections, diabetes, or protein in the urine.  An ultrasound to confirm the proper growth and development of the baby.  An amniocentesis to check for possible genetic problems.  Fetal screens for spina bifida and Down syndrome.  You may need other tests to make sure you and the baby are doing well. HOME CARE INSTRUCTIONS  Medicines  Follow your health care provider's instructions regarding medicine use. Specific medicines may be either safe or unsafe to take during pregnancy.  Take your prenatal vitamins as directed.  If you develop constipation, try taking a stool softener if your health care provider approves. Diet  Eat regular, well-balanced meals. Choose a variety of foods, such as meat or vegetable-based protein, fish, milk and low-fat dairy products, vegetables, fruits, and whole grain breads and cereals. Your health care provider will help you determine the amount of weight gain that is right for you.  Avoid raw meat and uncooked cheese. These carry germs that can cause birth defects in the baby.  Eating four or five small meals rather than three large meals a day may help relieve nausea and vomiting. If you start to feel nauseous, eating a few soda crackers can be helpful. Drinking liquids between meals instead of during meals also seems to help nausea and vomiting.  If you develop constipation, eat more high-fiber foods, such as fresh vegetables or fruit and whole grains. Drink enough fluids to keep your urine clear or pale yellow. Activity and Exercise  Exercise only as directed by your health care provider. Exercising will help you:  Control your weight.  Stay in shape.  Be prepared for labor and delivery.  Experiencing pain or cramping in the lower abdomen or low back is a good sign that you should stop exercising. Check with your health care provider before continuing normal exercises.  Try to avoid standing for long periods of  time. Move your legs often if you must stand in one place for a long time.  Avoid heavy lifting.  Wear low-heeled shoes, and practice good posture.  You may continue to have sex unless your health care provider directs   you otherwise. Relief of Pain or Discomfort  Wear a good support bra for breast tenderness.    Take warm sitz baths to soothe any pain or discomfort caused by hemorrhoids. Use hemorrhoid cream if your health care provider approves.    Rest with your legs elevated if you have leg cramps or low back pain.  If you develop varicose veins in your legs, wear support hose. Elevate your feet for 15 minutes, 3-4 times a day. Limit salt in your diet. Prenatal Care  Schedule your prenatal visits by the twelfth week of pregnancy. They are usually scheduled monthly at first, then more often in the last 2 months before delivery.  Write down your questions. Take them to your prenatal visits.  Keep all your prenatal visits as directed by your health care provider. Safety  Wear your seat belt at all times when driving.  Make a list of emergency phone numbers, including numbers for family, friends, the hospital, and police and fire departments. General Tips  Ask your health care provider for a referral to a local prenatal education class. Begin classes no later than at the beginning of month 6 of your pregnancy.  Ask for help if you have counseling or nutritional needs during pregnancy. Your health care provider can offer advice or refer you to specialists for help with various needs.  Do not use hot tubs, steam rooms, or saunas.  Do not douche or use tampons or scented sanitary pads.  Do not cross your legs for long periods of time.  Avoid cat litter boxes and soil used by cats. These carry germs that can cause birth defects in the baby and possibly loss of the fetus by miscarriage or stillbirth.  Avoid all smoking, herbs, alcohol, and medicines not prescribed by your health  care provider. Chemicals in these affect the formation and growth of the baby.  Schedule a dentist appointment. At home, brush your teeth with a soft toothbrush and be gentle when you floss. SEEK MEDICAL CARE IF:   You have dizziness.  You have mild pelvic cramps, pelvic pressure, or nagging pain in the abdominal area.  You have persistent nausea, vomiting, or diarrhea.  You have a bad smelling vaginal discharge.  You have pain with urination.  You notice increased swelling in your face, hands, legs, or ankles. SEEK IMMEDIATE MEDICAL CARE IF:   You have a fever.  You are leaking fluid from your vagina.  You have spotting or bleeding from your vagina.  You have severe abdominal cramping or pain.  You have rapid weight gain or loss.  You vomit blood or material that looks like coffee grounds.  You are exposed to German measles and have never had them.  You are exposed to fifth disease or chickenpox.  You develop a severe headache.  You have shortness of breath.  You have any kind of trauma, such as from a fall or a car accident. Document Released: 03/04/2001 Document Revised: 07/25/2013 Document Reviewed: 01/18/2013 Exit[employer] Patient Information 2015 [employer]. This information is not intended to replace advice given to you by your health care provider. Make sure you discuss any questions you have with your health care provider.   

## 2023-01-12 NOTE — Progress Notes (Signed)
[place] 12+1 wks,measurements c/w dates,posterior placenta,normal ovaries,FHR 159 bpm,NB present,NT 1.8 mm,CRL 59.64 mm

## 2023-01-12 NOTE — Progress Notes (Signed)
INITIAL OBSTETRICAL VISIT Patient name: Veronica Allen MRN [id number]  Date of birth: Feb 14, 1996 Chief Complaint:   Initial Prenatal Visit  History of Present Illness:   Veronica Allen is a 27 y.o. G40P0000 Caucasian female at [redacted]w[redacted]d by [place] at 6 weeks with an Estimated Date of Delivery: 07/26/23 being seen today for her initial obstetrical visit.   Patient's last menstrual period was 10/08/2022. Her obstetrical history is significant for primigravida.   Today she reports nausea, requests refill on zofran. Thinks some may be reflux, feels like something is just stuck in throat.  Last pap 11/11/22. Results were: NILM w/ HRHPV negative     01/12/2023    9:32 AM 11/11/2022   11:15 AM 07/07/2022   10:13 AM 11/07/2019    8:57 AM 04/24/2016    2:22 PM  Depression screen PHQ 2/9  Decreased Interest 0 0 0 0 0  Down, Depressed, Hopeless 0 0 0 0 0  PHQ - 2 Score 0 0 0 0 0  Altered sleeping 0 0 0 0   Tired, decreased energy 0 0 0 0   Change in appetite 0 0 0 0   Feeling bad or failure about yourself  0 0 0 0   Trouble concentrating 0 0 0 0   Moving slowly or fidgety/restless 0 0 0 0   Suicidal thoughts 0 0 0 0   PHQ-9 Score 0 0 0 0         01/12/2023    9:33 AM 11/11/2022   11:15 AM 07/07/2022   10:13 AM 11/07/2019    8:59 AM  GAD 7 : Generalized Anxiety Score  Nervous, Anxious, on Edge 0 1 1 0  Control/stop worrying 0 0 1 0  Worry too much - different things 0 0 1 0  Trouble relaxing 0 0 1 0  Restless 0 0 0 0  Easily annoyed or irritable 0 0 0 0  Afraid - awful might happen 0 0 0 0  Total GAD 7 Score 0 1 4 0  Anxiety Difficulty   Somewhat difficult      Review of Systems:   Pertinent items are noted in HPI Denies cramping/contractions, leakage of fluid, vaginal bleeding, abnormal vaginal discharge w/ itching/odor/irritation, headaches, visual changes, shortness of breath, chest pain, abdominal pain, severe nausea/vomiting, or problems with urination or bowel movements  unless otherwise stated above.  Pertinent History Reviewed:  Reviewed past medical,surgical, social, obstetrical and family history.  Reviewed problem list, medications and allergies. OB History  Gravida Para Term Preterm AB Living  1 0 0 0 0 0  SAB IAB Ectopic Multiple Live Births  0 0 0 0 0    # Outcome Date GA Lbr Len/2nd Weight Sex Type Anes PTL Lv  1 Current            Physical Assessment:   Vitals:   01/12/23 0900  BP: 130/78  Pulse: (!) 104  Weight: 145 lb (65.8 kg)  Body mass index is 25.69 kg/m.       Physical Examination:  General appearance - well appearing, and in no distress  Mental status - alert, oriented to person, place, and time  Psych:  She has a normal mood and affect  Skin - warm and dry, normal color, no suspicious lesions noted  Chest - effort normal, all lung fields clear to auscultation bilaterally  Heart - normal rate and regular rhythm  Abdomen - soft, nontender  Extremities:  No swelling or  varicosities noted  Thin prep pap is not done   Chaperone: N/A    TODAY'S NT [place] 12+1 wks,measurements c/w dates,posterior placenta,normal ovaries,FHR 159 bpm,NB present,NT 1.8 mm,CRL 59.64 mm   No results found for this or any previous visit (from the past 24 hour(s)).  Assessment & Plan:  1) Low-Risk Pregnancy G1P0000 at [redacted]w[redacted]d with an Estimated Date of Delivery: 07/26/23   2) Initial OB visit  3) Nausea, ? Reflux> refilled zofran, try TUMS or OTC antacid  4) H/O trivial mitral valve regurg> on echo done 08/12/22 for murmur, EF 55-60%. Per LHE, repeat echo @ 28wks  Meds:  Meds ordered this encounter  Medications   ondansetron (ZOFRAN) 4 MG tablet    Sig: Take 1 tablet (4 mg total) by mouth every 8 (eight) hours as needed.    Dispense:  20 tablet    Refill:  6    Initial labs obtained Continue prenatal vitamins Reviewed n/v relief measures and warning s/s to report Reviewed recommended weight gain based on pre-gravid BMI Encouraged well-balanced  diet Genetic & carrier screening discussed: requests NT/IT, declines Panorama, AFP, and Horizon  Ultrasound discussed; fetal survey: requested CCNC completed> form faxed if has or is planning to apply for medicaid The nature of [employer] for [employer] with multiple MDs and other Advanced Practice Providers was explained to patient; also emphasized that fellows, residents, and students are part of our team. Does have home bp cuff. Office bp cuff given: no. Rx sent: n/a. Check bp weekly, let us know if consistently >140/90.   Indications for ASA therapy (per uptodate) OR Two or more of the following: Nulliparity Yes No other indications  Indications for early A1C (per uptodate) BMI >=25 (>=23 in Asian women) AND one of the following None  Follow-up: Return in about 4 weeks (around 02/09/2023) for LROB, 2nd IT, CNM, in person; then 7wks from now anatomy u/s and LROB w/ CNM.   Orders Placed This Encounter  Procedures   Urine Culture   GC/Chlamydia Probe Amp   Integrated 1   CBC/D/Plt+RPR+Rh+ABO+RubIgG...    Cheral Marker CNM, [facility] 01/12/2023 10:01 AM

## 2023-01-13 ENCOUNTER — Encounter: Payer: Self-pay | Admitting: Women's Health

## 2023-01-13 DIAGNOSIS — O26899 Other specified pregnancy related conditions, unspecified trimester: Secondary | ICD-10-CM | POA: Insufficient documentation

## 2023-01-14 LAB — INTEGRATED 1
Crown Rump Length: 59.6 mm
Gest. Age on Collection Date: 12.3 wk
Maternal Age at EDD: 28.6 a
Nuchal Translucency (NT): 1.8 mm
Number of Fetuses: 1
PAPP-A Value: 1627.9 ng/mL
Sonographer ID#: 309760
Weight: 145 [lb_av]

## 2023-01-14 LAB — CBC/D/PLT+RPR+RH+ABO+RUBIGG...
Antibody Screen: NEGATIVE
Basophils Absolute: 0.1 10*3/uL (ref 0.0–0.2)
Basos: 1 %
EOS (ABSOLUTE): 0.1 10*3/uL (ref 0.0–0.4)
Eos: 1 %
HCV Ab: NONREACTIVE
HIV Screen 4th Generation wRfx: NONREACTIVE
Hematocrit: 40.7 % (ref 34.0–46.6)
Hemoglobin: 13.4 g/dL (ref 11.1–15.9)
Hepatitis B Surface Ag: NEGATIVE
Immature Grans (Abs): 0 10*3/uL (ref 0.0–0.1)
Immature Granulocytes: 0 %
Lymphocytes Absolute: 1.9 10*3/uL (ref 0.7–3.1)
Lymphs: 23 %
MCH: 30 pg (ref 26.6–33.0)
MCHC: 32.9 g/dL (ref 31.5–35.7)
MCV: 91 fL (ref 79–97)
Monocytes Absolute: 0.7 10*3/uL (ref 0.1–0.9)
Monocytes: 9 %
Neutrophils Absolute: 5.5 10*3/uL (ref 1.4–7.0)
Neutrophils: 66 %
Platelets: 388 10*3/uL (ref 150–450)
RBC: 4.47 x10E6/uL (ref 3.77–5.28)
RDW: 11.4 % — ABNORMAL LOW (ref 11.7–15.4)
RPR Ser Ql: NONREACTIVE
Rh Factor: NEGATIVE
Rubella Antibodies, IGG: 1.59 {index} (ref >0.99)
WBC: 8.4 10*3/uL (ref 3.4–10.8)

## 2023-01-14 LAB — GC/CHLAMYDIA PROBE AMP
Chlamydia trachomatis, NAA: NEGATIVE
Neisseria Gonorrhoeae by PCR: NEGATIVE

## 2023-01-14 LAB — HCV INTERPRETATION

## 2023-01-16 LAB — URINE CULTURE

## 2023-01-19 ENCOUNTER — Encounter: Payer: Self-pay | Admitting: Pharmacist

## 2023-01-19 ENCOUNTER — Other Ambulatory Visit (HOSPITAL_COMMUNITY): Payer: Self-pay

## 2023-01-19 ENCOUNTER — Encounter: Payer: Self-pay | Admitting: Women's Health

## 2023-01-19 ENCOUNTER — Other Ambulatory Visit: Payer: Self-pay

## 2023-01-19 DIAGNOSIS — R8271 Bacteriuria: Secondary | ICD-10-CM | POA: Insufficient documentation

## 2023-01-19 MED ORDER — NITROFURANTOIN MONOHYD MACRO 100 MG PO CAPS
100.0000 mg | ORAL_CAPSULE | Freq: Two times a day (BID) | ORAL | 0 refills | Status: DC
  Filled 2023-01-19: qty 14, 7d supply, fill #0

## 2023-01-19 NOTE — Addendum Note (Signed)
Addended by: Cheral Marker on: 01/19/2023 09:57 AM   Modules accepted: Orders

## 2023-02-09 ENCOUNTER — Ambulatory Visit (INDEPENDENT_AMBULATORY_CARE_PROVIDER_SITE_OTHER): Payer: 59 | Admitting: Women's Health

## 2023-02-09 ENCOUNTER — Other Ambulatory Visit: Payer: Self-pay

## 2023-02-09 ENCOUNTER — Encounter: Payer: Self-pay | Admitting: Women's Health

## 2023-02-09 VITALS — BP 124/77 | HR 95 | Wt 147.4 lb

## 2023-02-09 DIAGNOSIS — Z1379 Encounter for other screening for genetic and chromosomal anomalies: Secondary | ICD-10-CM

## 2023-02-09 DIAGNOSIS — Z87440 Personal history of urinary (tract) infections: Secondary | ICD-10-CM

## 2023-02-09 DIAGNOSIS — R8271 Bacteriuria: Secondary | ICD-10-CM

## 2023-02-09 DIAGNOSIS — Z3402 Encounter for supervision of normal first pregnancy, second trimester: Secondary | ICD-10-CM

## 2023-02-09 DIAGNOSIS — Z3A16 16 weeks gestation of pregnancy: Secondary | ICD-10-CM

## 2023-02-09 DIAGNOSIS — O99891 Other specified diseases and conditions complicating pregnancy: Secondary | ICD-10-CM

## 2023-02-09 DIAGNOSIS — Z363 Encounter for antenatal screening for malformations: Secondary | ICD-10-CM

## 2023-02-09 IMAGING — DX DG CHEST 2V
2 series · 2 of 2 positions shown · non-contrast
Comparison: None.

CLINICAL DATA: Chronic cough

EXAM:
CHEST - 2 VIEW

[chest pa]
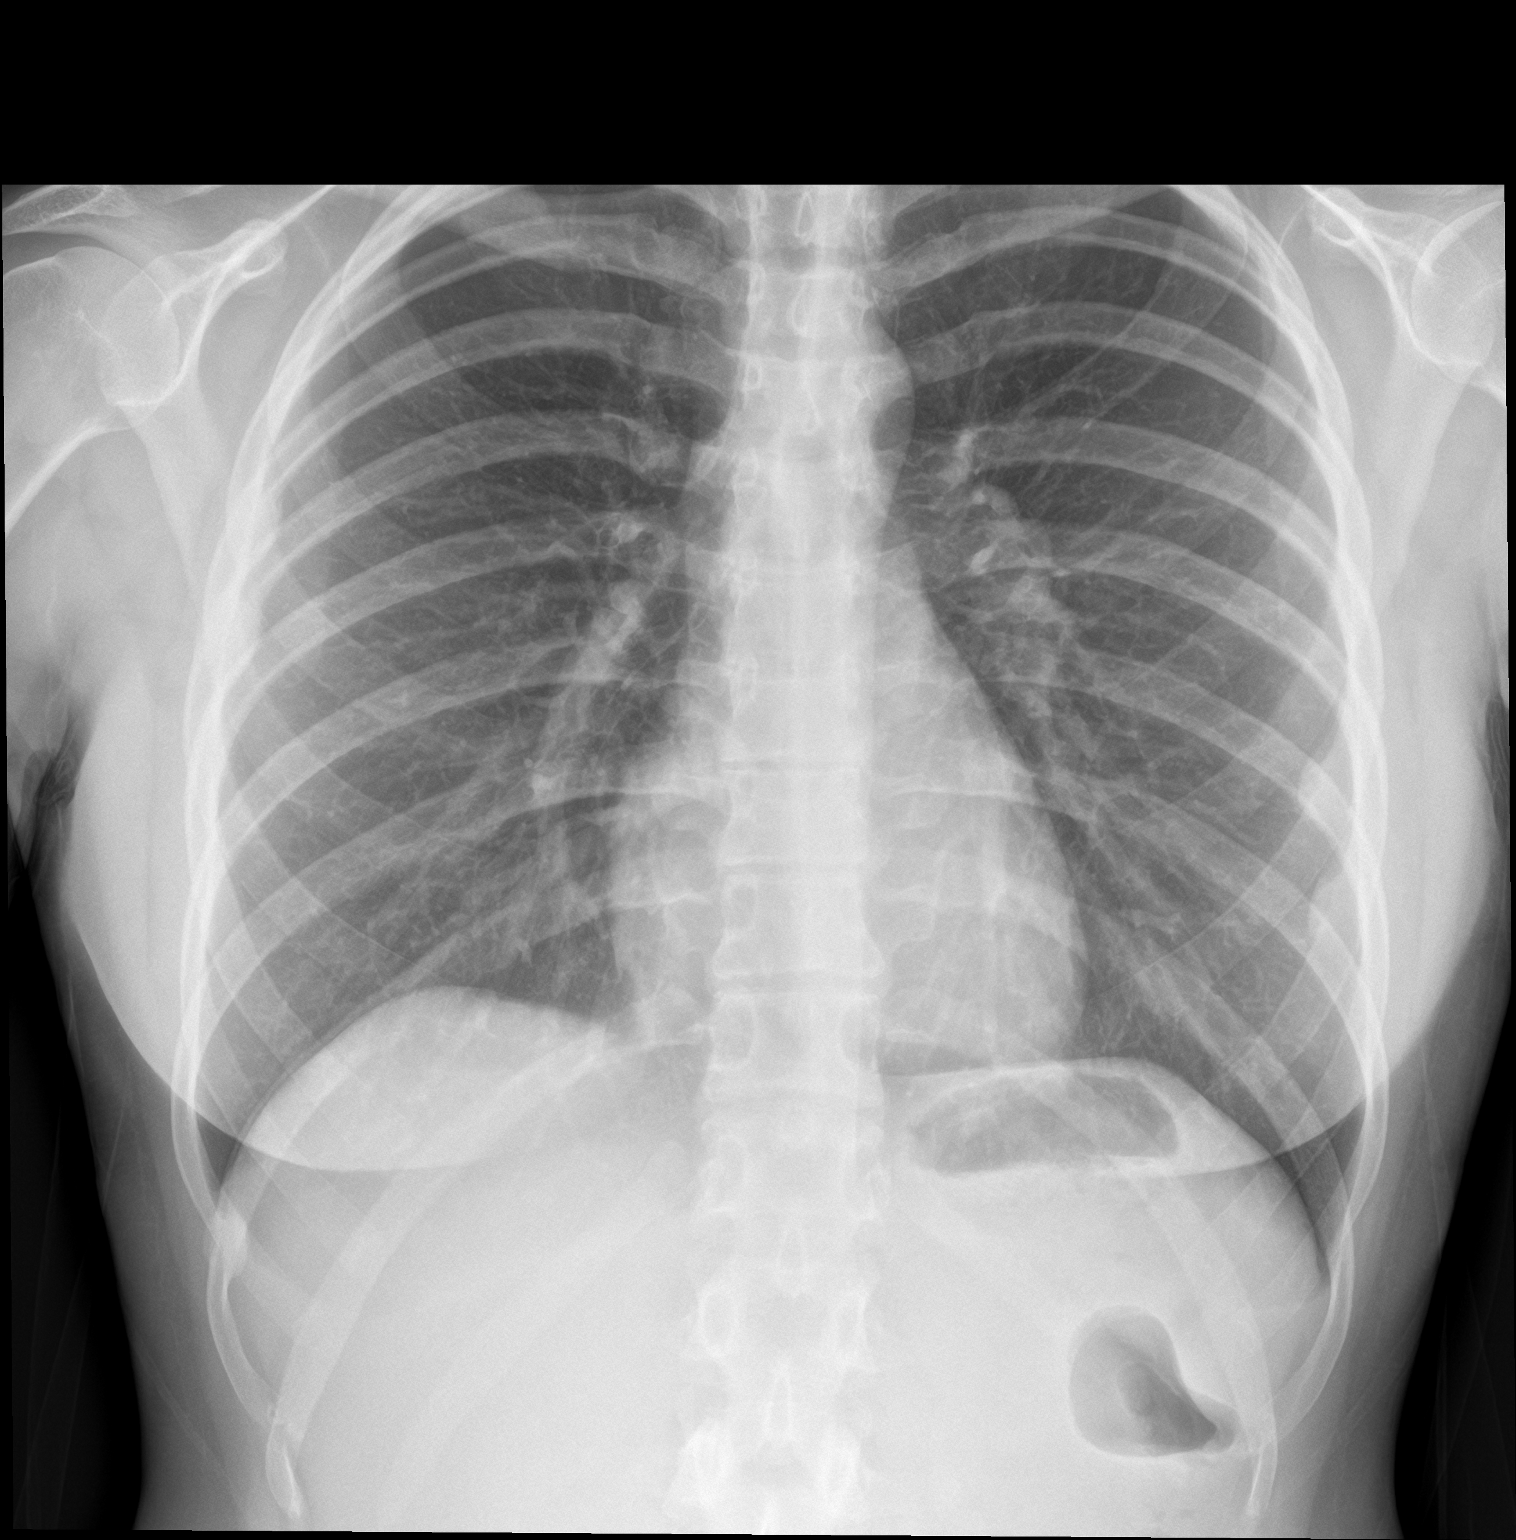

[chest lat]
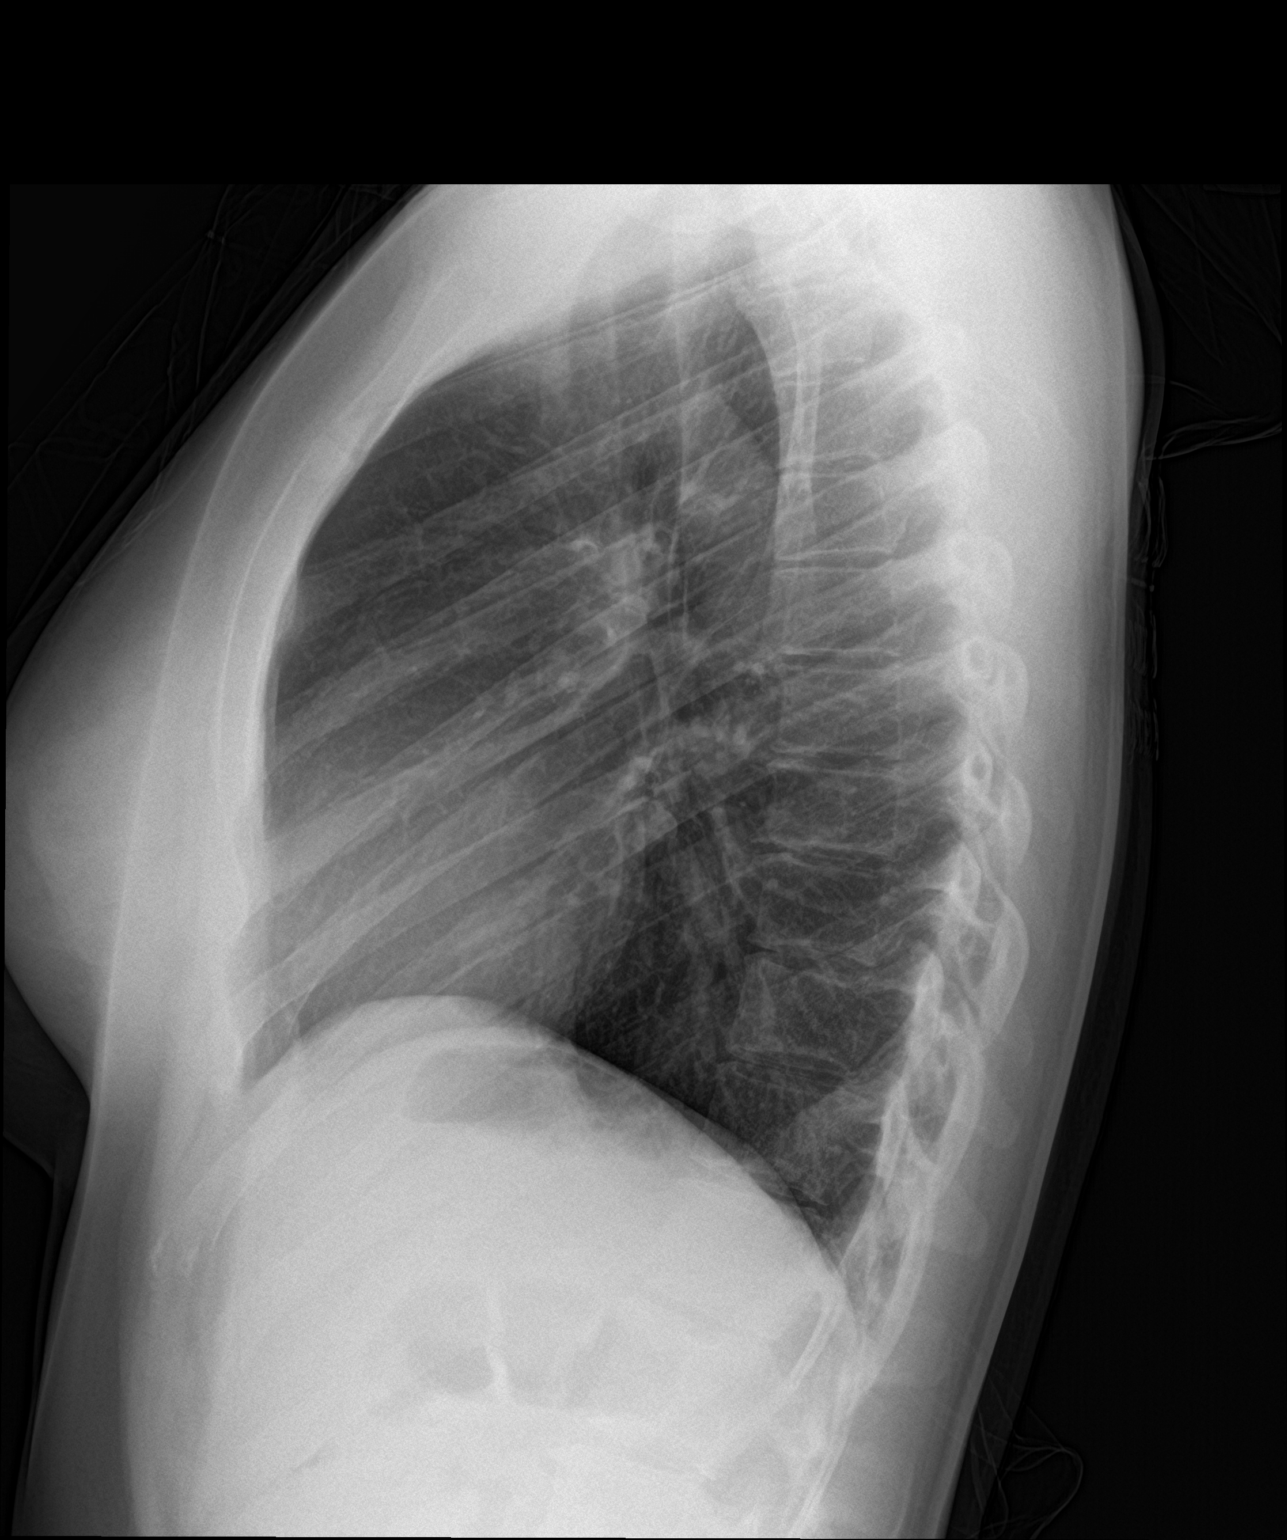

[2 of 2 positions shown; findings below may reference images not displayed]

FINDINGS: The cardiomediastinal silhouette is normal. No pneumothorax. No
pulmonary infiltrates. Rounded densities over the peripheries of
both lungs projected over ribs most consistent with healed rib
fractures. Recommend clinical correlation.
IMPRESSION: Probable bilateral healed rib fractures. Recommend clinical
correlation.

No other abnormalities identified.

## 2023-02-09 NOTE — Patient Instructions (Signed)
Veronica Allen, thank you for choosing our office today! We appreciate the opportunity to meet your healthcare needs. You may receive a short survey by mail, e-mail, or through [employer]. If you are happy with your care we would appreciate if you could take just a few minutes to complete the survey questions. We read all of your comments and take your feedback very seriously. Thank you again for choosing our office.  Center for [employer] Team at [place] at [facility] ([place], [place] [address]) Entrance C, located off of [address] parking  Go to [email or web address] to register for FREE online childbirth classes  Call the office [phone]) or go to [facility] if: You begin to severe cramping Your water breaks.  Sometimes it is a big gush of fluid, sometimes it is just a trickle that keeps getting your panties wet or running down your legs You have vaginal bleeding.  It is normal to have a small amount of spotting if your cervix was checked.   [facility] Pediatricians/Family Doctors [facility]): [address] [place], [place] [facility]: [address] [place], [phone]                [facility]): [place], [phone] (call to ask if accepting patients) [facility] Department: [address], [place], [place] Pediatricians/Family Doctors [facility]): [place]. [place], [address], [place] [facility]: [address] [place], [place] [facility]: [place], [place] [facility]): [phone] [facility]: [address] [place]: [address] [place] [facility]: [phone], [phone]  Home Blood Pressure Monitoring for Patients   Your provider has recommended that you check your blood pressure (BP) at least once a week at home. If you do not have a blood pressure cuff at home, one will be provided for you. Contact your provider if you have not received your monitor within 1 week.   Helpful Tips for Accurate Home Blood Pressure Checks  Don't smoke, exercise, or drink caffeine 30 minutes before checking your BP Use the restroom before checking your BP (a full bladder can raise your pressure) Relax in a comfortable upright chair Feet on the ground Left arm resting comfortably on a flat surface at the level of your heart Legs uncrossed Back supported Sit quietly and don't talk Place the cuff on your bare arm Adjust snuggly, so that only two fingertips can fit between your skin and the top of the cuff Check 2 readings separated by at least one minute Keep a log of your BP readings For a visual, please reference this diagram: http://ccnc.care/bpdiagram  Provider Name: [facility]     Phone: [phone]  Zone 1: ALL CLEAR  Continue to monitor your symptoms:  BP reading is less than 140 (top number) or less than 90 (bottom number)  No right upper stomach pain No headaches or seeing spots No feeling nauseated or throwing up No swelling in face and hands  Zone 2: CAUTION Call your doctor's office for any of the following:  BP reading is greater than 140 (top number) or greater than  90 (bottom number)  Stomach pain under your ribs in the middle or right side Headaches or seeing spots Feeling nauseated or throwing up Swelling in face and hands  Zone 3: EMERGENCY  Seek immediate medical care if you have any of the following:  BP reading is greater than160 (top number) or greater than 110 (bottom number) Severe headaches not improving with Tylenol Serious difficulty catching your breath Any worsening symptoms from  Zone 2     Second Trimester of Pregnancy The second trimester is from week 14 through week 27 (months 4 through 6). The second trimester is often a time when you feel your best. Your body has adjusted to being pregnant, and you begin to feel better physically. Usually, morning sickness has lessened or quit completely, you may have more energy, and you may have an increase in appetite. The second trimester is also a time when the fetus is growing rapidly. At the end of the sixth month, the fetus is about 9 inches long and weighs about 1 pounds. You will likely begin to feel the baby move (quickening) between 16 and 20 weeks of pregnancy. Body changes during your second trimester Your body continues to go through many changes during your second trimester. The changes vary from woman to woman. Your weight will continue to increase. You will notice your lower abdomen bulging out. You may begin to get stretch marks on your hips, abdomen, and breasts. You may develop headaches that can be relieved by medicines. The medicines should be approved by your health care provider. You may urinate more often because the fetus is pressing on your bladder. You may develop or continue to have heartburn as a result of your pregnancy. You may develop constipation because certain hormones are causing the muscles that push waste through your intestines to slow down. You may develop hemorrhoids or swollen, bulging veins (varicose veins). You may have back pain. This is caused by: Weight gain. Pregnancy hormones that are relaxing the joints in your pelvis. A shift in weight and the muscles that support your balance. Your breasts will continue to grow and they will continue to become tender. Your gums may bleed and may be sensitive to brushing and flossing. Dark spots or blotches (chloasma, mask of pregnancy) may develop on your face. This will likely fade after the baby is born. A dark line from your belly button to  the pubic area (linea nigra) may appear. This will likely fade after the baby is born. You may have changes in your hair. These can include thickening of your hair, rapid growth, and changes in texture. Some women also have hair loss during or after pregnancy, or hair that feels dry or thin. Your hair will most likely return to normal after your baby is born.  What to expect at prenatal visits During a routine prenatal visit: You will be weighed to make sure you and the fetus are growing normally. Your blood pressure will be taken. Your abdomen will be measured to track your baby's growth. The fetal heartbeat will be listened to. Any test results from the previous visit will be discussed.  Your health care provider may ask you: How you are feeling. If you are feeling the baby move. If you have had any abnormal symptoms, such as leaking fluid, bleeding, severe headaches, or abdominal cramping. If you are using any tobacco products, including cigarettes, chewing tobacco, and electronic cigarettes. If you have any questions.  Other tests that may be performed during   your second trimester include: Blood tests that check for: Low iron levels (anemia). High blood sugar that affects pregnant women (gestational diabetes) between 41 and 28 weeks. Rh antibodies. This is to check for a protein on red blood cells (Rh factor). Urine tests to check for infections, diabetes, or protein in the urine. An ultrasound to confirm the proper growth and development of the baby. An amniocentesis to check for possible genetic problems. Fetal screens for spina bifida and Down syndrome. HIV (human immunodeficiency virus) testing. Routine prenatal testing includes screening for HIV, unless you choose not to have this test.  Follow these instructions at home: Medicines Follow your health care provider's instructions regarding medicine use. Specific medicines may be either safe or unsafe to take during  pregnancy. Take a prenatal vitamin that contains at least 600 micrograms (mcg) of folic acid. If you develop constipation, try taking a stool softener if your health care provider approves. Eating and drinking Eat a balanced diet that includes fresh fruits and vegetables, whole grains, good sources of protein such as meat, eggs, or tofu, and low-fat dairy. Your health care provider will help you determine the amount of weight gain that is right for you. Avoid raw meat and uncooked cheese. These carry germs that can cause birth defects in the baby. If you have low calcium intake from food, talk to your health care provider about whether you should take a daily calcium supplement. Limit foods that are high in fat and processed sugars, such as fried and sweet foods. To prevent constipation: Drink enough fluid to keep your urine clear or pale yellow. Eat foods that are high in fiber, such as fresh fruits and vegetables, whole grains, and beans. Activity Exercise only as directed by your health care provider. Most women can continue their usual exercise routine during pregnancy. Try to exercise for 30 minutes at least 5 days a week. Stop exercising if you experience uterine contractions. Avoid heavy lifting, wear low heel shoes, and practice good posture. A sexual relationship may be continued unless your health care provider directs you otherwise. Relieving pain and discomfort Wear a good support bra to prevent discomfort from breast tenderness. Take warm sitz baths to soothe any pain or discomfort caused by hemorrhoids. Use hemorrhoid cream if your health care provider approves. Rest with your legs elevated if you have leg cramps or low back pain. If you develop varicose veins, wear support hose. Elevate your feet for 15 minutes, 3-4 times a day. Limit salt in your diet. Prenatal Care Write down your questions. Take them to your prenatal visits. Keep all your prenatal visits as told by your health  care provider. This is important. Safety Wear your seat belt at all times when driving. Make a list of emergency phone numbers, including numbers for family, friends, the hospital, and police and fire departments. General instructions Ask your health care provider for a referral to a local prenatal education class. Begin classes no later than the beginning of month 6 of your pregnancy. Ask for help if you have counseling or nutritional needs during pregnancy. Your health care provider can offer advice or refer you to specialists for help with various needs. Do not use hot tubs, steam rooms, or saunas. Do not douche or use tampons or scented sanitary pads. Do not cross your legs for long periods of time. Avoid cat litter boxes and soil used by cats. These carry germs that can cause birth defects in the baby and possibly loss of the  fetus by miscarriage or stillbirth. Avoid all smoking, herbs, alcohol, and unprescribed drugs. Chemicals in these products can affect the formation and growth of the baby. Do not use any products that contain nicotine or tobacco, such as cigarettes and e-cigarettes. If you need help quitting, ask your health care provider. Visit your dentist if you have not gone yet during your pregnancy. Use a soft toothbrush to brush your teeth and be gentle when you floss. Contact a health care provider if: You have dizziness. You have mild pelvic cramps, pelvic pressure, or nagging pain in the abdominal area. You have persistent nausea, vomiting, or diarrhea. You have a bad smelling vaginal discharge. You have pain when you urinate. Get help right away if: You have a fever. You are leaking fluid from your vagina. You have spotting or bleeding from your vagina. You have severe abdominal cramping or pain. You have rapid weight gain or weight loss. You have shortness of breath with chest pain. You notice sudden or extreme swelling of your face, hands, ankles, feet, or legs. You  have not felt your baby move in over an hour. You have severe headaches that do not go away when you take medicine. You have vision changes. Summary The second trimester is from week 14 through week 27 (months 4 through 6). It is also a time when the fetus is growing rapidly. Your body goes through many changes during pregnancy. The changes vary from woman to woman. Avoid all smoking, herbs, alcohol, and unprescribed drugs. These chemicals affect the formation and growth your baby. Do not use any tobacco products, such as cigarettes, chewing tobacco, and e-cigarettes. If you need help quitting, ask your health care provider. Contact your health care provider if you have any questions. Keep all prenatal visits as told by your health care provider. This is important. This information is not intended to replace advice given to you by your health care provider. Make sure you discuss any questions you have with your health care provider. Document Released: 03/04/2001 Document Revised: 08/16/2015 Document Reviewed: 05/11/2012 Elsevier Interactive Patient Education  2017 [employer].

## 2023-02-09 NOTE — Progress Notes (Signed)
LOW-RISK PREGNANCY VISIT Patient name: Veronica Allen MRN [id number]  Date of birth: 12-25-1995 Chief Complaint:   Routine Prenatal Visit (2nd IT & Panorama today! Repeat urine culture today!!!)  History of Present Illness:   Veronica Allen is a 27 y.o. G1P0000 female at [redacted]w[redacted]d with an Estimated Date of Delivery: 07/26/23 being seen today for ongoing management of a low-risk pregnancy.   Today she reports no complaints. Changed mind, wants Panorama today. Went to [employer], it's a boy! Contractions: Not present. Vag. Bleeding: None.   . denies leaking of fluid.     01/12/2023    9:32 AM 11/11/2022   11:15 AM 07/07/2022   10:13 AM 11/07/2019    8:57 AM 04/24/2016    2:22 PM  Depression screen PHQ 2/9  Decreased Interest 0 0 0 0 0  Down, Depressed, Hopeless 0 0 0 0 0  PHQ - 2 Score 0 0 0 0 0  Altered sleeping 0 0 0 0   Tired, decreased energy 0 0 0 0   Change in appetite 0 0 0 0   Feeling bad or failure about yourself  0 0 0 0   Trouble concentrating 0 0 0 0   Moving slowly or fidgety/restless 0 0 0 0   Suicidal thoughts 0 0 0 0   PHQ-9 Score 0 0 0 0         01/12/2023    9:33 AM 11/11/2022   11:15 AM 07/07/2022   10:13 AM 11/07/2019    8:59 AM  GAD 7 : Generalized Anxiety Score  Nervous, Anxious, on Edge 0 1 1 0  Control/stop worrying 0 0 1 0  Worry too much - different things 0 0 1 0  Trouble relaxing 0 0 1 0  Restless 0 0 0 0  Easily annoyed or irritable 0 0 0 0  Afraid - awful might happen 0 0 0 0  Total GAD 7 Score 0 1 4 0  Anxiety Difficulty   Somewhat difficult       Review of Systems:   Pertinent items are noted in HPI Denies abnormal vaginal discharge w/ itching/odor/irritation, headaches, visual changes, shortness of breath, chest pain, abdominal pain, severe nausea/vomiting, or problems with urination or bowel movements unless otherwise stated above. Pertinent History Reviewed:  Reviewed past medical,surgical, social, obstetrical and family history.   Reviewed problem list, medications and allergies. Physical Assessment:   Vitals:   02/09/23 1115  BP: 124/77  Pulse: 95  Weight: 147 lb 6.4 oz (66.9 kg)  Body mass index is 26.11 kg/m.        Physical Examination:   General appearance: Well appearing, and in no distress  Mental status: Alert, oriented to person, place, and time  Skin: Warm & dry  Cardiovascular: Normal heart rate noted  Respiratory: Normal respiratory effort, no distress  Abdomen: Soft, gravid, nontender  Pelvic: Cervical exam deferred         Extremities: Edema: None  Fetal Status: Fetal Heart Rate (bpm): 148        Chaperone: N/A   No results found for this or any previous visit (from the past 24 hour(s)).  Assessment & Plan:  1) Low-risk pregnancy G1P0000 at [redacted]w[redacted]d with an Estimated Date of Delivery: 07/26/23   2) Trivial MV regurg, per LHE, repeat maternal echo @ 28wk  3) Recent ASB>urine cx poc today   Meds: No orders of the defined types were placed in this encounter.  Labs/procedures today: urine  culture, 2nd IT, and Panorama  Plan:  Continue routine obstetrical care  Next visit: prefers will be in person for u/s     Reviewed: Preterm labor symptoms and general obstetric precautions including but not limited to vaginal bleeding, contractions, leaking of fluid and fetal movement were reviewed in detail with the patient.  All questions were answered. Does have home bp cuff. Office bp cuff given: not applicable. Check bp weekly, let us know if consistently >140 and/or >90.  Follow-up: Return for As scheduled.  Future Appointments  Date Time Provider Department Center  03/03/2023  8:30 AM [facility] 2 [facility] None  03/03/2023  9:30 AM Cheral Marker, CNM [facility]    Orders Placed This Encounter  Procedures   Urine Culture   US OB Comp + 14 Wk   INTEGRATED 2   PANORAMA PRENATAL TEST   Cheral Marker CNM, [facility] 02/09/2023 11:53 AM

## 2023-02-11 LAB — INTEGRATED 2
AFP MoM: 0.56
Alpha-Fetoprotein: 18.4 ng/mL
Crown Rump Length: 59.6 mm
DIA MoM: 1.87
DIA Value: 308.1 pg/mL
Estriol, Unconjugated: 1.2 ng/mL
Gest. Age on Collection Date: 12.3 wk
Gestational Age: 16.3 wk
Maternal Age at EDD: 28.6 a
Nuchal Translucency (NT): 1.8 mm
Nuchal Translucency MoM: 1.38
Number of Fetuses: 1
PAPP-A MoM: 1.7
PAPP-A Value: 1627.9 ng/mL
Sonographer ID#: 309760
Test Results:: NEGATIVE
Weight: 145 [lb_av]
Weight: 147 [lb_av]
hCG MoM: 4.34
hCG Value: 150.7 [IU]/mL
uE3 MoM: 1.35

## 2023-02-12 ENCOUNTER — Other Ambulatory Visit (HOSPITAL_COMMUNITY): Payer: Self-pay

## 2023-02-12 LAB — URINE CULTURE

## 2023-02-12 MED ORDER — NITROFURANTOIN MONOHYD MACRO 100 MG PO CAPS
100.0000 mg | ORAL_CAPSULE | Freq: Two times a day (BID) | ORAL | 0 refills | Status: DC
  Filled 2023-02-12: qty 14, 7d supply, fill #0

## 2023-02-12 NOTE — Addendum Note (Signed)
Addended by: Cheral Marker on: 02/12/2023 03:44 PM   Modules accepted: Orders

## 2023-02-13 ENCOUNTER — Telehealth: Payer: Self-pay | Admitting: *Deleted

## 2023-02-13 NOTE — Telephone Encounter (Signed)
Called patient to inform of positive urine culture. Patient states she saw the result on mychart and has received a call from the pharmacy.  Will pick up medication today. Advised to take all of the medication. Verbalized understanding.

## 2023-02-16 LAB — PANORAMA PRENATAL TEST FULL PANEL:PANORAMA TEST PLUS 5 ADDITIONAL MICRODELETIONS: FETAL FRACTION: 13.8

## 2023-02-23 ENCOUNTER — Encounter: Payer: Self-pay | Admitting: Women's Health

## 2023-03-03 ENCOUNTER — Encounter: Payer: Self-pay | Admitting: Women's Health

## 2023-03-03 ENCOUNTER — Ambulatory Visit (INDEPENDENT_AMBULATORY_CARE_PROVIDER_SITE_OTHER): Payer: 59 | Admitting: Women's Health

## 2023-03-03 ENCOUNTER — Ambulatory Visit (INDEPENDENT_AMBULATORY_CARE_PROVIDER_SITE_OTHER): Payer: 59 | Admitting: Radiology

## 2023-03-03 VITALS — BP 124/79 | HR 86 | Wt 153.0 lb

## 2023-03-03 DIAGNOSIS — Z3A19 19 weeks gestation of pregnancy: Secondary | ICD-10-CM

## 2023-03-03 DIAGNOSIS — I340 Nonrheumatic mitral (valve) insufficiency: Secondary | ICD-10-CM

## 2023-03-03 DIAGNOSIS — Z3402 Encounter for supervision of normal first pregnancy, second trimester: Secondary | ICD-10-CM

## 2023-03-03 DIAGNOSIS — Z5189 Encounter for other specified aftercare: Secondary | ICD-10-CM | POA: Diagnosis not present

## 2023-03-03 DIAGNOSIS — Z363 Encounter for antenatal screening for malformations: Secondary | ICD-10-CM

## 2023-03-03 DIAGNOSIS — O99412 Diseases of the circulatory system complicating pregnancy, second trimester: Secondary | ICD-10-CM

## 2023-03-03 DIAGNOSIS — R8271 Bacteriuria: Secondary | ICD-10-CM

## 2023-03-03 NOTE — Progress Notes (Signed)
GA 19+2 Single active female fetus, cephalic FHR = 144 bpm  posterior placenta with central ci AFI = 15 cm 62%   SVP = 6.4 cm  Overall subjective appearance of high normal amniotic fluid volume for 19 weeks and possible early placental thinning.  Would like follow up ~ 28 weeks Anatomy screen normal - no apparent abn seen  EFW 82.6%  326g CL = 3.8 cm,  closed      normal ovaries, neg adnexal regions, neg CDS - no free fluid

## 2023-03-03 NOTE — Patient Instructions (Signed)
Lilia Pro, thank you for choosing our office today! We appreciate the opportunity to meet your healthcare needs. You may receive a short survey by mail, e-mail, or through [employer]. If you are happy with your care we would appreciate if you could take just a few minutes to complete the survey questions. We read all of your comments and take your feedback very seriously. Thank you again for choosing our office.  Center for [employer] Team at [place] at [facility] ([place], [place] [address]) Entrance C, located off of [address] parking  Go to [email or web address] to register for FREE online childbirth classes  Call the office [phone]) or go to [facility] if: You begin to severe cramping Your water breaks.  Sometimes it is a big gush of fluid, sometimes it is just a trickle that keeps getting your panties wet or running down your legs You have vaginal bleeding.  It is normal to have a small amount of spotting if your cervix was checked.   [facility] Pediatricians/Family Doctors [facility]): [address] [place], [place] [facility]: [address] [place], [phone]                [facility]): [place], [phone] (call to ask if accepting patients) [facility] Department: [address], [place], [place] Pediatricians/Family Doctors [facility]): [place]. [place], [address], [place] [facility]: [address] [place], [place] [facility]: [place], [place] [facility]): [phone] [facility]: [address] [place]: [address] [place] [facility]: [phone], [phone]  Home Blood Pressure Monitoring for Patients   Your provider has recommended that you check your blood pressure (BP) at least once a week at home. If you do not have a blood pressure cuff at home, one will be provided for you. Contact your provider if you have not received your monitor within 1 week.   Helpful Tips for Accurate Home Blood Pressure Checks  Don't smoke, exercise, or drink caffeine 30 minutes before checking your BP Use the restroom before checking your BP (a full bladder can raise your pressure) Relax in a comfortable upright chair Feet on the ground Left arm resting comfortably on a flat surface at the level of your heart Legs uncrossed Back supported Sit quietly and don't talk Place the cuff on your bare arm Adjust snuggly, so that only two fingertips can fit between your skin and the top of the cuff Check 2 readings separated by at least one minute Keep a log of your BP readings For a visual, please reference this diagram: http://ccnc.care/bpdiagram  Provider Name: [facility]     Phone: [phone]  Zone 1: ALL CLEAR  Continue to monitor your symptoms:  BP reading is less than 140 (top number) or less than 90 (bottom number)  No right upper stomach pain No headaches or seeing spots No feeling nauseated or throwing up No swelling in face and hands  Zone 2: CAUTION Call your doctor's office for any of the following:  BP reading is greater than 140 (top number) or greater than  90 (bottom number)  Stomach pain under your ribs in the middle or right side Headaches or seeing spots Feeling nauseated or throwing up Swelling in face and hands  Zone 3: EMERGENCY  Seek immediate medical care if you have any of the following:  BP reading is greater than160 (top number) or greater than 110 (bottom number) Severe headaches not improving with Tylenol Serious difficulty catching your breath Any worsening symptoms from  Zone 2     Second Trimester of Pregnancy The second trimester is from week 14 through week 27 (months 4 through 6). The second trimester is often a time when you feel your best. Your body has adjusted to being pregnant, and you begin to feel better physically. Usually, morning sickness has lessened or quit completely, you may have more energy, and you may have an increase in appetite. The second trimester is also a time when the fetus is growing rapidly. At the end of the sixth month, the fetus is about 9 inches long and weighs about 1 pounds. You will likely begin to feel the baby move (quickening) between 16 and 20 weeks of pregnancy. Body changes during your second trimester Your body continues to go through many changes during your second trimester. The changes vary from woman to woman. Your weight will continue to increase. You will notice your lower abdomen bulging out. You may begin to get stretch marks on your hips, abdomen, and breasts. You may develop headaches that can be relieved by medicines. The medicines should be approved by your health care provider. You may urinate more often because the fetus is pressing on your bladder. You may develop or continue to have heartburn as a result of your pregnancy. You may develop constipation because certain hormones are causing the muscles that push waste through your intestines to slow down. You may develop hemorrhoids or swollen, bulging veins (varicose veins). You may have back pain. This is caused by: Weight gain. Pregnancy hormones that are relaxing the joints in your pelvis. A shift in weight and the muscles that support your balance. Your breasts will continue to grow and they will continue to become tender. Your gums may bleed and may be sensitive to brushing and flossing. Dark spots or blotches (chloasma, mask of pregnancy) may develop on your face. This will likely fade after the baby is born. A dark line from your belly button to  the pubic area (linea nigra) may appear. This will likely fade after the baby is born. You may have changes in your hair. These can include thickening of your hair, rapid growth, and changes in texture. Some women also have hair loss during or after pregnancy, or hair that feels dry or thin. Your hair will most likely return to normal after your baby is born.  What to expect at prenatal visits During a routine prenatal visit: You will be weighed to make sure you and the fetus are growing normally. Your blood pressure will be taken. Your abdomen will be measured to track your baby's growth. The fetal heartbeat will be listened to. Any test results from the previous visit will be discussed.  Your health care provider may ask you: How you are feeling. If you are feeling the baby move. If you have had any abnormal symptoms, such as leaking fluid, bleeding, severe headaches, or abdominal cramping. If you are using any tobacco products, including cigarettes, chewing tobacco, and electronic cigarettes. If you have any questions.  Other tests that may be performed during   your second trimester include: Blood tests that check for: Low iron levels (anemia). High blood sugar that affects pregnant women (gestational diabetes) between 41 and 28 weeks. Rh antibodies. This is to check for a protein on red blood cells (Rh factor). Urine tests to check for infections, diabetes, or protein in the urine. An ultrasound to confirm the proper growth and development of the baby. An amniocentesis to check for possible genetic problems. Fetal screens for spina bifida and Down syndrome. HIV (human immunodeficiency virus) testing. Routine prenatal testing includes screening for HIV, unless you choose not to have this test.  Follow these instructions at home: Medicines Follow your health care provider's instructions regarding medicine use. Specific medicines may be either safe or unsafe to take during  pregnancy. Take a prenatal vitamin that contains at least 600 micrograms (mcg) of folic acid. If you develop constipation, try taking a stool softener if your health care provider approves. Eating and drinking Eat a balanced diet that includes fresh fruits and vegetables, whole grains, good sources of protein such as meat, eggs, or tofu, and low-fat dairy. Your health care provider will help you determine the amount of weight gain that is right for you. Avoid raw meat and uncooked cheese. These carry germs that can cause birth defects in the baby. If you have low calcium intake from food, talk to your health care provider about whether you should take a daily calcium supplement. Limit foods that are high in fat and processed sugars, such as fried and sweet foods. To prevent constipation: Drink enough fluid to keep your urine clear or pale yellow. Eat foods that are high in fiber, such as fresh fruits and vegetables, whole grains, and beans. Activity Exercise only as directed by your health care provider. Most women can continue their usual exercise routine during pregnancy. Try to exercise for 30 minutes at least 5 days a week. Stop exercising if you experience uterine contractions. Avoid heavy lifting, wear low heel shoes, and practice good posture. A sexual relationship may be continued unless your health care provider directs you otherwise. Relieving pain and discomfort Wear a good support bra to prevent discomfort from breast tenderness. Take warm sitz baths to soothe any pain or discomfort caused by hemorrhoids. Use hemorrhoid cream if your health care provider approves. Rest with your legs elevated if you have leg cramps or low back pain. If you develop varicose veins, wear support hose. Elevate your feet for 15 minutes, 3-4 times a day. Limit salt in your diet. Prenatal Care Write down your questions. Take them to your prenatal visits. Keep all your prenatal visits as told by your health  care provider. This is important. Safety Wear your seat belt at all times when driving. Make a list of emergency phone numbers, including numbers for family, friends, the hospital, and police and fire departments. General instructions Ask your health care provider for a referral to a local prenatal education class. Begin classes no later than the beginning of month 6 of your pregnancy. Ask for help if you have counseling or nutritional needs during pregnancy. Your health care provider can offer advice or refer you to specialists for help with various needs. Do not use hot tubs, steam rooms, or saunas. Do not douche or use tampons or scented sanitary pads. Do not cross your legs for long periods of time. Avoid cat litter boxes and soil used by cats. These carry germs that can cause birth defects in the baby and possibly loss of the  fetus by miscarriage or stillbirth. Avoid all smoking, herbs, alcohol, and unprescribed drugs. Chemicals in these products can affect the formation and growth of the baby. Do not use any products that contain nicotine or tobacco, such as cigarettes and e-cigarettes. If you need help quitting, ask your health care provider. Visit your dentist if you have not gone yet during your pregnancy. Use a soft toothbrush to brush your teeth and be gentle when you floss. Contact a health care provider if: You have dizziness. You have mild pelvic cramps, pelvic pressure, or nagging pain in the abdominal area. You have persistent nausea, vomiting, or diarrhea. You have a bad smelling vaginal discharge. You have pain when you urinate. Get help right away if: You have a fever. You are leaking fluid from your vagina. You have spotting or bleeding from your vagina. You have severe abdominal cramping or pain. You have rapid weight gain or weight loss. You have shortness of breath with chest pain. You notice sudden or extreme swelling of your face, hands, ankles, feet, or legs. You  have not felt your baby move in over an hour. You have severe headaches that do not go away when you take medicine. You have vision changes. Summary The second trimester is from week 14 through week 27 (months 4 through 6). It is also a time when the fetus is growing rapidly. Your body goes through many changes during pregnancy. The changes vary from woman to woman. Avoid all smoking, herbs, alcohol, and unprescribed drugs. These chemicals affect the formation and growth your baby. Do not use any tobacco products, such as cigarettes, chewing tobacco, and e-cigarettes. If you need help quitting, ask your health care provider. Contact your health care provider if you have any questions. Keep all prenatal visits as told by your health care provider. This is important. This information is not intended to replace advice given to you by your health care provider. Make sure you discuss any questions you have with your health care provider. Document Released: 03/04/2001 Document Revised: 08/16/2015 Document Reviewed: 05/11/2012 Elsevier Interactive Patient Education  2017 [employer].

## 2023-03-03 NOTE — Progress Notes (Signed)
LOW-RISK PREGNANCY VISIT Patient name: Veronica Allen MRN [id number]  Date of birth: 07/14/1995 Chief Complaint:   Routine Prenatal Visit (Urine culture)  History of Present Illness:   [name] is a 27 y.o. G1P0000 female at [redacted]w[redacted]d with an Estimated Date of Delivery: 07/26/23 being seen today for ongoing management of a low-risk pregnancy.   Today she reports no complaints. Contractions: Not present.  .  Movement: Present. denies leaking of fluid.     01/12/2023    9:32 AM 11/11/2022   11:15 AM 07/07/2022   10:13 AM 11/07/2019    8:57 AM 04/24/2016    2:22 PM  Depression screen PHQ 2/9  Decreased Interest 0 0 0 0 0  Down, Depressed, Hopeless 0 0 0 0 0  PHQ - 2 Score 0 0 0 0 0  Altered sleeping 0 0 0 0   Tired, decreased energy 0 0 0 0   Change in appetite 0 0 0 0   Feeling bad or failure about yourself  0 0 0 0   Trouble concentrating 0 0 0 0   Moving slowly or fidgety/restless 0 0 0 0   Suicidal thoughts 0 0 0 0   PHQ-9 Score 0 0 0 0         01/12/2023    9:33 AM 11/11/2022   11:15 AM 07/07/2022   10:13 AM 11/07/2019    8:59 AM  GAD 7 : Generalized Anxiety Score  Nervous, Anxious, on Edge 0 1 1 0  Control/stop worrying 0 0 1 0  Worry too much - different things 0 0 1 0  Trouble relaxing 0 0 1 0  Restless 0 0 0 0  Easily annoyed or irritable 0 0 0 0  Afraid - awful might happen 0 0 0 0  Total GAD 7 Score 0 1 4 0  Anxiety Difficulty   Somewhat difficult       Review of Systems:   Pertinent items are noted in HPI Denies abnormal vaginal discharge w/ itching/odor/irritation, headaches, visual changes, shortness of breath, chest pain, abdominal pain, severe nausea/vomiting, or problems with urination or bowel movements unless otherwise stated above. Pertinent History Reviewed:  Reviewed past medical,surgical, social, obstetrical and family history.  Reviewed problem list, medications and allergies. Physical Assessment:   Vitals:   03/03/23 0915  BP:  124/79  Pulse: 86  Weight: 153 lb (69.4 kg)  Body mass index is 27.1 kg/m.        Physical Examination:   General appearance: Well appearing, and in no distress  Mental status: Alert, oriented to person, place, and time  Skin: Warm & dry  Cardiovascular: Normal heart rate noted  Respiratory: Normal respiratory effort, no distress  Abdomen: Soft, gravid, nontender  Pelvic: Cervical exam deferred         Extremities: Edema: None  Fetal Status:     Movement: Present   GA 19+2 Single active female fetus, cephalic FHR = 144 bpm  posterior placenta with central ci AFI = 15 cm 62%   SVP = 6.4 cm  Overall subjective appearance of high normal amniotic fluid volume for 19 weeks and possible early placental thinning.  Would like follow up ~ 28 weeks Anatomy screen normal - no apparent abn seen  EFW 82.6%  326g CL = 3.8 cm,  closed      normal ovaries, neg adnexal regions, neg CDS - no free fluid   Chaperone: N/A   No results found for  this or any previous visit (from the past 24 hour(s)).  Assessment & Plan:  1) Low-risk pregnancy G1P0000 at [redacted]w[redacted]d with an Estimated Date of Delivery: 07/26/23   2) Recent ASB, urine cx poc today  3) Trivial MVR> per LHE repeat echo @ 28wk, order placed today, note routed to [facility] to schedule  4) ?Placental thinning per u/s notes> reviewed w/ [facility], no need for further f/u right now, low threshold for EFW/AFI u/s in future    Meds: No orders of the defined types were placed in this encounter.  Labs/procedures today: U/S  Plan:  Continue routine obstetrical care  Next visit: prefers in person    Reviewed: Preterm labor symptoms and general obstetric precautions including but not limited to vaginal bleeding, contractions, leaking of fluid and fetal movement were reviewed in detail with the patient.  All questions were answered. Does have home bp cuff. Office bp cuff given: not applicable. Check bp weekly, let us know if consistently >140 and/or >90.  Follow-up:  No follow-ups on file.  No future appointments.  Orders Placed This Encounter  Procedures   Urine Culture   Cheral Marker CNM, [facility] 03/03/2023 9:40 AM

## 2023-03-06 LAB — URINE CULTURE

## 2023-03-09 ENCOUNTER — Encounter: Payer: Self-pay | Admitting: Women's Health

## 2023-03-14 ENCOUNTER — Other Ambulatory Visit (HOSPITAL_COMMUNITY): Payer: Self-pay

## 2023-03-16 ENCOUNTER — Encounter: Payer: Self-pay | Admitting: Women's Health

## 2023-03-25 NOTE — L&D Delivery Note (Signed)
 OB/GYN Faculty Practice Delivery Note  Veronica Allen is a 28 y.o. G1P1001 s/p SVD at [redacted]w[redacted]d. She was admitted for IOL for GHTN.   ROM: 13h 26m with clear fluid GBS Status:  Negative/-- (04/11 1500) Maximum Maternal Temperature: 100.75F  Labor Progress: Initial SVE: 2.5/50/-3. Ripened with Cytotec . Augmented with Pitocin  and AROM. Epidural. She then progressed to complete @0343 .   Delivery Date/Time: 07/22/2023 @0731   Delivery: Called to room and patient was complete and pushing. Head delivered LOA. No nuchal cord present. Shoulder and body delivered in usual fashion. Infant with spontaneous cry, placed on mother's abdomen, dried and stimulated. Cord clamped x 2 after 1-minute delay, and cut by FOB. Cord blood drawn. Placenta delivered spontaneously with gentle cord traction. Fundus firm with massage and Pitocin . Labia, perineum, vagina, and cervix inspected with 2nd degree perineal laceration. Repaired with 3.0 Vicryl.   Baby Weight: 4180g  Placenta: 3 vessel, intact. Sent to L&D Complications: Prolonged 2nd stage Lacerations: 2nd degree perineal  EBL: 250 mL Analgesia: Epidural   Infant:  APGAR (1 MIN):  6 APGAR (5 MINS):  9  [place] End, MD OB Family Medicine Fellow, [facility] for [facility], [facility] 07/23/2023, 8:20 PM

## 2023-03-27 ENCOUNTER — Other Ambulatory Visit: Payer: Self-pay | Admitting: Family Medicine

## 2023-03-27 ENCOUNTER — Encounter: Payer: Self-pay | Admitting: Nurse Practitioner

## 2023-03-27 ENCOUNTER — Other Ambulatory Visit: Payer: Self-pay | Admitting: Nurse Practitioner

## 2023-03-29 ENCOUNTER — Other Ambulatory Visit: Payer: Self-pay | Admitting: Nurse Practitioner

## 2023-03-30 ENCOUNTER — Other Ambulatory Visit: Payer: Self-pay | Admitting: Nurse Practitioner

## 2023-03-30 MED ORDER — VALACYCLOVIR HCL 1 G PO TABS
1000.0000 mg | ORAL_TABLET | Freq: Every day | ORAL | 1 refills | Status: DC
  Filled 2023-03-30 – 2023-04-01 (×2): qty 90, 90d supply, fill #0
  Filled 2023-06-15 – 2023-06-16 (×2): qty 90, 90d supply, fill #1

## 2023-03-31 ENCOUNTER — Other Ambulatory Visit: Payer: Self-pay

## 2023-03-31 ENCOUNTER — Other Ambulatory Visit (HOSPITAL_COMMUNITY): Payer: Self-pay

## 2023-04-01 ENCOUNTER — Other Ambulatory Visit (HOSPITAL_COMMUNITY): Payer: Self-pay

## 2023-04-02 ENCOUNTER — Encounter: Payer: Self-pay | Admitting: Obstetrics & Gynecology

## 2023-04-02 ENCOUNTER — Ambulatory Visit (INDEPENDENT_AMBULATORY_CARE_PROVIDER_SITE_OTHER): Payer: Commercial Managed Care - PPO | Admitting: Obstetrics & Gynecology

## 2023-04-02 ENCOUNTER — Other Ambulatory Visit (HOSPITAL_COMMUNITY): Payer: Self-pay

## 2023-04-02 VITALS — BP 128/79 | HR 110 | Wt 164.0 lb

## 2023-04-02 DIAGNOSIS — Z3402 Encounter for supervision of normal first pregnancy, second trimester: Secondary | ICD-10-CM

## 2023-04-02 DIAGNOSIS — I340 Nonrheumatic mitral (valve) insufficiency: Secondary | ICD-10-CM

## 2023-04-02 MED ORDER — OMEPRAZOLE 20 MG PO CPDR
20.0000 mg | DELAYED_RELEASE_CAPSULE | Freq: Every day | ORAL | 6 refills | Status: DC
  Filled 2023-04-02: qty 30, 30d supply, fill #0
  Filled 2023-04-30: qty 30, 30d supply, fill #1
  Filled 2023-05-28: qty 30, 30d supply, fill #2
  Filled 2023-06-29: qty 30, 30d supply, fill #3

## 2023-04-02 NOTE — Progress Notes (Signed)
   LOW-RISK PREGNANCY VISIT Patient name: Veronica Allen MRN [id number]  Date of birth: December 17, 1995 Chief Complaint:   Routine Prenatal Visit  History of Present Illness:   Veronica Allen is a 28 y.o. G1P0000 female at [redacted]w[redacted]d with an Estimated Date of Delivery: 07/26/23 being seen today for ongoing management of a low-risk pregnancy.     01/12/2023    9:32 AM 11/11/2022   11:15 AM 07/07/2022   10:13 AM 11/07/2019    8:57 AM 04/24/2016    2:22 PM  Depression screen PHQ 2/9  Decreased Interest 0 0 0 0 0  Down, Depressed, Hopeless 0 0 0 0 0  PHQ - 2 Score 0 0 0 0 0  Altered sleeping 0 0 0 0   Tired, decreased energy 0 0 0 0   Change in appetite 0 0 0 0   Feeling bad or failure about yourself  0 0 0 0   Trouble concentrating 0 0 0 0   Moving slowly or fidgety/restless 0 0 0 0   Suicidal thoughts 0 0 0 0   PHQ-9 Score 0 0 0 0     Today she reports no complaints. Contractions: Not present. Vag. Bleeding: None.  Movement: Present. denies leaking of fluid. Review of Systems:   Pertinent items are noted in HPI Denies abnormal vaginal discharge w/ itching/odor/irritation, headaches, visual changes, shortness of breath, chest pain, abdominal pain, severe nausea/vomiting, or problems with urination or bowel movements unless otherwise stated above. Pertinent History Reviewed:  Reviewed past medical,surgical, social, obstetrical and family history.  Reviewed problem list, medications and allergies. Physical Assessment:   Vitals:   04/02/23 1008  BP: 128/79  Pulse: (!) 110  Weight: 164 lb (74.4 kg)  Body mass index is 29.05 kg/m.        Physical Examination:   General appearance: Well appearing, and in no distress  Mental status: Alert, oriented to person, place, and time  Skin: Warm & dry  Cardiovascular: Normal heart rate noted  Respiratory: Normal respiratory effort, no distress  Abdomen: Soft, gravid, nontender  Pelvic:          Extremities:    Fetal Status: Fetal Heart  Rate (bpm): 148 Fundal Height: 24 cm Movement: Present    Chaperone: n/a    No results found for this or any previous visit (from the past 24 hours).  Assessment & Plan:  1) Low-risk pregnancy G1P0000 at [redacted]w[redacted]d with an Estimated Date of Delivery: 07/26/23   2) Check growth at 32 weeks,   3) MVR>trivial recheck at 28 weeks   Meds:  Meds ordered this encounter  Medications   omeprazole  (PRILOSEC) 20 MG capsule    Sig: Take 1 capsule (20 mg total) by mouth daily. 1 tablet a day    Dispense:  30 capsule    Refill:  6   Labs/procedures today:   Plan:  Continue routine obstetrical care  Next visit: prefers in person      Follow-up: Return in about 4 weeks (around 04/30/2023) for PN2, LROB.  No orders of the defined types were placed in this encounter.   Vonn VEAR Inch, MD 04/02/2023 10:29 AM

## 2023-04-30 ENCOUNTER — Encounter: Payer: Self-pay | Admitting: Women's Health

## 2023-04-30 ENCOUNTER — Other Ambulatory Visit (HOSPITAL_COMMUNITY): Payer: Self-pay

## 2023-04-30 ENCOUNTER — Ambulatory Visit: Payer: Commercial Managed Care - PPO | Admitting: Women's Health

## 2023-04-30 ENCOUNTER — Other Ambulatory Visit: Payer: Commercial Managed Care - PPO

## 2023-04-30 VITALS — BP 127/77 | HR 96 | Wt 168.0 lb

## 2023-04-30 DIAGNOSIS — Z3402 Encounter for supervision of normal first pregnancy, second trimester: Secondary | ICD-10-CM | POA: Diagnosis not present

## 2023-04-30 DIAGNOSIS — Z131 Encounter for screening for diabetes mellitus: Secondary | ICD-10-CM

## 2023-04-30 DIAGNOSIS — Z23 Encounter for immunization: Secondary | ICD-10-CM | POA: Diagnosis not present

## 2023-04-30 DIAGNOSIS — Z3A27 27 weeks gestation of pregnancy: Secondary | ICD-10-CM | POA: Diagnosis not present

## 2023-04-30 NOTE — Patient Instructions (Signed)
Veronica Allen, thank you for choosing our office today! We appreciate the opportunity to meet your healthcare needs. You may receive a short survey by mail, e-mail, or through [employer]. If you are happy with your care we would appreciate if you could take just a few minutes to complete the survey questions. We read all of your comments and take your feedback very seriously. Thank you again for choosing our office.  Center for [employer] Team at [facility] ([place], [place] [address]) Entrance C, located off of [address] parking   CLASSES: Go to [email or web address] to register for classes (childbirth, breastfeeding, waterbirth, infant CPR, daddy bootcamp, etc.)  Call the office [phone]) or go to [facility] if: You begin to have strong, frequent contractions Your water breaks.  Sometimes it is a big gush of fluid, sometimes it is just a trickle that keeps getting your panties wet or running down your legs You have vaginal bleeding.  It is normal to have a small amount of spotting if your cervix was checked.  You don't feel your baby moving like normal.  If you don't, get you something to eat and drink and lay down and focus on feeling your baby move.   If your baby is still not moving like normal, you should call the office or go to [facility].  Call the office [phone]) or go to [facility] hospital for these signs of pre-eclampsia: Severe headache that does not go away with Tylenol Visual changes- seeing spots, double, blurred vision Pain under your right breast or upper abdomen that does not go away with Tums or heartburn medicine Nausea and/or vomiting Severe swelling in your hands, feet, and face   Tdap Vaccine It is recommended that you get the Tdap vaccine during the third trimester of EACH pregnancy to help protect your baby from getting pertussis (whooping cough) 27-36 weeks is the BEST time to do  this so that you can pass the protection on to your baby. During pregnancy is better than after pregnancy, but if you are unable to get it during pregnancy it will be offered at the hospital.  You can get this vaccine with [place], at the health department, your family doctor, or some local pharmacies Everyone who will be around your baby should also be up-to-date on their vaccines before the baby comes. Adults (who are not pregnant) only need 1 dose of Tdap during adulthood.   [facility] Pediatricians/Family Doctors [facility]): [address] [place], [facility]: [address] [place], [phone]                [facility]): [place], [phone] (call to ask if accepting patients) [facility] Department: [address], [place], [place] Pediatricians/Family Doctors [facility]): [place]. [place], [address], [place] [facility]: [address] [place], [place] [facility]: [place], [place] [facility]): [phone] [facility]: [address] [place]: [address], [place] [facility]: [phone], [phone]  Home Blood Pressure Monitoring for Patients   Your provider has recommended that you check your  blood pressure (BP) at least once a week at home. If you do not have a blood pressure cuff at home, one will be provided for you. Contact your provider if you have not received your monitor within 1 week.   Helpful Tips for Accurate Home Blood Pressure Checks  Don't smoke, exercise, or drink caffeine 30 minutes before checking your BP Use the restroom before checking your BP (a full bladder can raise your  pressure) Relax in a comfortable upright chair Feet on the ground Left arm resting comfortably on a flat surface at the level of your heart Legs uncrossed Back supported Sit quietly and don't talk Place the cuff on your bare arm Adjust snuggly, so that only two fingertips can fit between your skin and the top of the cuff Check 2 readings separated by at least one minute Keep a log of your BP readings For a visual, please reference this diagram: http://ccnc.care/bpdiagram  Provider Name: [facility]     Phone: [phone]  Zone 1: ALL CLEAR  Continue to monitor your symptoms:  BP reading is less than 140 (top number) or less than 90 (bottom number)  No right upper stomach pain No headaches or seeing spots No feeling nauseated or throwing up No swelling in face and hands  Zone 2: CAUTION Call your doctor's office for any of the following:  BP reading is greater than 140 (top number) or greater than 90 (bottom number)  Stomach pain under your ribs in the middle or right side Headaches or seeing spots Feeling nauseated or throwing up Swelling in face and hands  Zone 3: EMERGENCY  Seek immediate medical care if you have any of the following:  BP reading is greater than160 (top number) or greater than 110 (bottom number) Severe headaches not improving with Tylenol Serious difficulty catching your breath Any worsening symptoms from Zone 2   Third Trimester of Pregnancy The third trimester is from week 29 through week 42, months 7 through 9. The third trimester is a time when the fetus is growing rapidly. At the end of the ninth month, the fetus is about 20 inches in length and weighs 6-10 pounds.  BODY CHANGES Your body goes through many changes during pregnancy. The changes vary from woman to woman.  Your weight will continue to increase. You can expect to gain 25-35 pounds (11-16 kg) by the end of the pregnancy. You may begin to get stretch marks on your hips, abdomen,  and breasts. You may urinate more often because the fetus is moving lower into your pelvis and pressing on your bladder. You may develop or continue to have heartburn as a result of your pregnancy. You may develop constipation because certain hormones are causing the muscles that push waste through your intestines to slow down. You may develop hemorrhoids or swollen, bulging veins (varicose veins). You may have pelvic pain because of the weight gain and pregnancy hormones relaxing your joints between the bones in your pelvis. Backaches may result from overexertion of the muscles supporting your posture. You may have changes in your hair. These can include thickening of your hair, rapid growth, and changes in texture. Some women also have hair loss during or after pregnancy, or hair that feels dry or thin. Your hair will most likely return to normal after your baby is born. Your breasts will continue to grow and be tender. A yellow discharge may leak from your breasts called colostrum. Your belly button may stick out. You may   feel short of breath because of your expanding uterus. You may notice the fetus "dropping," or moving lower in your abdomen. You may have a bloody mucus discharge. This usually occurs a few days to a week before labor begins. Your cervix becomes thin and soft (effaced) near your due date. WHAT TO EXPECT AT YOUR PRENATAL EXAMS  You will have prenatal exams every 2 weeks until week 36. Then, you will have weekly prenatal exams. During a routine prenatal visit: You will be weighed to make sure you and the fetus are growing normally. Your blood pressure is taken. Your abdomen will be measured to track your baby's growth. The fetal heartbeat will be listened to. Any test results from the previous visit will be discussed. You may have a cervical check near your due date to see if you have effaced. At around 36 weeks, your caregiver will check your cervix. At the same time, your  caregiver will also perform a test on the secretions of the vaginal tissue. This test is to determine if a type of bacteria, Group B streptococcus, is present. Your caregiver will explain this further. Your caregiver may ask you: What your birth plan is. How you are feeling. If you are feeling the baby move. If you have had any abnormal symptoms, such as leaking fluid, bleeding, severe headaches, or abdominal cramping. If you have any questions. Other tests or screenings that may be performed during your third trimester include: Blood tests that check for low iron levels (anemia). Fetal testing to check the health, activity level, and growth of the fetus. Testing is done if you have certain medical conditions or if there are problems during the pregnancy. FALSE LABOR You may feel small, irregular contractions that eventually go away. These are called Braxton Hicks contractions, or false labor. Contractions may last for hours, days, or even weeks before true labor sets in. If contractions come at regular intervals, intensify, or become painful, it is best to be seen by your caregiver.  SIGNS OF LABOR  Menstrual-like cramps. Contractions that are 5 minutes apart or less. Contractions that start on the top of the uterus and spread down to the lower abdomen and back. A sense of increased pelvic pressure or back pain. A watery or bloody mucus discharge that comes from the vagina. If you have any of these signs before the 37th week of pregnancy, call your caregiver right away. You need to go to the hospital to get checked immediately. HOME CARE INSTRUCTIONS  Avoid all smoking, herbs, alcohol, and unprescribed drugs. These chemicals affect the formation and growth of the baby. Follow your caregiver's instructions regarding medicine use. There are medicines that are either safe or unsafe to take during pregnancy. Exercise only as directed by your caregiver. Experiencing uterine cramps is a good sign to  stop exercising. Continue to eat regular, healthy meals. Wear a good support bra for breast tenderness. Do not use hot tubs, steam rooms, or saunas. Wear your seat belt at all times when driving. Avoid raw meat, uncooked cheese, cat litter boxes, and soil used by cats. These carry germs that can cause birth defects in the baby. Take your prenatal vitamins. Try taking a stool softener (if your caregiver approves) if you develop constipation. Eat more high-fiber foods, such as fresh vegetables or fruit and whole grains. Drink plenty of fluids to keep your urine clear or pale yellow. Take warm sitz baths to soothe any pain or discomfort caused by hemorrhoids. Use hemorrhoid cream if   your caregiver approves. If you develop varicose veins, wear support hose. Elevate your feet for 15 minutes, 3-4 times a day. Limit salt in your diet. Avoid heavy lifting, wear low heal shoes, and practice good posture. Rest a lot with your legs elevated if you have leg cramps or low back pain. Visit your dentist if you have not gone during your pregnancy. Use a soft toothbrush to brush your teeth and be gentle when you floss. A sexual relationship may be continued unless your caregiver directs you otherwise. Do not travel far distances unless it is absolutely necessary and only with the approval of your caregiver. Take prenatal classes to understand, practice, and ask questions about the labor and delivery. Make a trial run to the hospital. Pack your hospital bag. Prepare the baby's nursery. Continue to go to all your prenatal visits as directed by your caregiver. SEEK MEDICAL CARE IF: You are unsure if you are in labor or if your water has broken. You have dizziness. You have mild pelvic cramps, pelvic pressure, or nagging pain in your abdominal area. You have persistent nausea, vomiting, or diarrhea. You have a bad smelling vaginal discharge. You have pain with urination. SEEK IMMEDIATE MEDICAL CARE IF:  You  have a fever. You are leaking fluid from your vagina. You have spotting or bleeding from your vagina. You have severe abdominal cramping or pain. You have rapid weight loss or gain. You have shortness of breath with chest pain. You notice sudden or extreme swelling of your face, hands, ankles, feet, or legs. You have not felt your baby move in over an hour. You have severe headaches that do not go away with medicine. You have vision changes. Document Released: 03/04/2001 Document Revised: 03/15/2013 Document Reviewed: 05/11/2012 ExitCare Patient Information 2015 [employer]. This information is not intended to replace advice given to you by your health care provider. Make sure you discuss any questions you have with your health care provider.       

## 2023-04-30 NOTE — Progress Notes (Signed)
 LOW-RISK PREGNANCY VISIT Patient name: Veronica Allen MRN [id number]  Date of birth: 04-19-1995 Chief Complaint:   Routine Prenatal Visit (PN2, tdap)  History of Present Illness:   Veronica Allen is a 28 y.o. G1P0000 female at [redacted]w[redacted]d with an Estimated Date of Delivery: 07/26/23 being seen today for ongoing management of a low-risk pregnancy.   Today she reports  continued allergies- prior to pregnancy would sometimes need to take xyzal at night, claritin in am for few days- doesn't make her drowsy. . Contractions: Not present.  .  Movement: Present. denies leaking of fluid.     01/12/2023    9:32 AM 11/11/2022   11:15 AM 07/07/2022   10:13 AM 11/07/2019    8:57 AM 04/24/2016    2:22 PM  Depression screen PHQ 2/9  Decreased Interest 0 0 0 0 0  Down, Depressed, Hopeless 0 0 0 0 0  PHQ - 2 Score 0 0 0 0 0  Altered sleeping 0 0 0 0   Tired, decreased energy 0 0 0 0   Change in appetite 0 0 0 0   Feeling bad or failure about yourself  0 0 0 0   Trouble concentrating 0 0 0 0   Moving slowly or fidgety/restless 0 0 0 0   Suicidal thoughts 0 0 0 0   PHQ-9 Score 0 0 0 0         01/12/2023    9:33 AM 11/11/2022   11:15 AM 07/07/2022   10:13 AM 11/07/2019    8:59 AM  GAD 7 : Generalized Anxiety Score  Nervous, Anxious, on Edge 0 1 1 0  Control/stop worrying 0 0 1 0  Worry too much - different things 0 0 1 0  Trouble relaxing 0 0 1 0  Restless 0 0 0 0  Easily annoyed or irritable 0 0 0 0  Afraid - awful might happen 0 0 0 0  Total GAD 7 Score 0 1 4 0  Anxiety Difficulty   Somewhat difficult       Review of Systems:   Pertinent items are noted in HPI Denies abnormal vaginal discharge w/ itching/odor/irritation, headaches, visual changes, shortness of breath, chest pain, abdominal pain, severe nausea/vomiting, or problems with urination or bowel movements unless otherwise stated above. Pertinent History Reviewed:  Reviewed past medical,surgical, social, obstetrical and  family history.  Reviewed problem list, medications and allergies. Physical Assessment:   Vitals:   04/30/23 0854  BP: 127/77  Pulse: 96  Weight: 168 lb (76.2 kg)  Body mass index is 29.76 kg/m.        Physical Examination:   General appearance: Well appearing, and in no distress  Mental status: Alert, oriented to person, place, and time  Skin: Warm & dry  Cardiovascular: Normal heart rate noted  Respiratory: Normal respiratory effort, no distress  Abdomen: Soft, gravid, nontender  Pelvic: Cervical exam deferred         Extremities: Edema: None  Fetal Status: Fetal Heart Rate (bpm): 145 Fundal Height: 28 cm Movement: Present    Chaperone: N/A   No results found for this or any previous visit (from the past 24 hours).  Assessment & Plan:  1) Low-risk pregnancy G1P0000 at [redacted]w[redacted]d with an Estimated Date of Delivery: 07/26/23   2) Trivial MVR, repeat echo 2/13 as scheduled  3) ?placental thinning at 19wks> rpt 32wks   Meds: No orders of the defined types were placed in this encounter.  Labs/procedures  today: Tdap and PN2  Plan:  Continue routine obstetrical care  Next visit: prefers in person    Reviewed: Preterm labor symptoms and general obstetric precautions including but not limited to vaginal bleeding, contractions, leaking of fluid and fetal movement were reviewed in detail with the patient.  All questions were answered. Does have home bp cuff. Office bp cuff given: not applicable. Check bp weekly, let us  know if consistently >140 and/or >90.  Follow-up: Return in about 3 weeks (around 05/21/2023) for LROB, CNM, in person; then 2wks later EFW u/s and LROB in person.  Future Appointments  Date Time Provider Department Center  05/07/2023  3:00 PM [facility] - ECHO 1 OUTPATIENT [facility] [place] [facility]    Orders Placed This Encounter  Procedures   Tdap vaccine greater than or equal to 7yo IM   Suzen JONELLE Fetters CNM, [facility] 04/30/2023 10:05 AM

## 2023-05-02 LAB — HIV ANTIBODY (ROUTINE TESTING W REFLEX)

## 2023-05-02 LAB — CBC
Hematocrit: 30.6 % — ABNORMAL LOW (ref 34.0–46.6)
Hemoglobin: 9.9 g/dL — ABNORMAL LOW (ref 11.1–15.9)
MCH: 29.2 pg (ref 26.6–33.0)
MCHC: 32.4 g/dL (ref 31.5–35.7)
MCV: 90 fL (ref 79–97)
Platelets: 420 10*3/uL (ref 150–450)
RBC: 3.39 x10E6/uL — ABNORMAL LOW (ref 3.77–5.28)
RDW: 12 % (ref 11.7–15.4)
WBC: 10.9 10*3/uL — ABNORMAL HIGH (ref 3.4–10.8)

## 2023-05-02 LAB — GLUCOSE TOLERANCE, 2 HOURS W/ 1HR
Glucose, 1 hour: 139 mg/dL (ref 70–179)
Glucose, 2 hour: 133 mg/dL (ref 70–152)
Glucose, Fasting: 81 mg/dL (ref 70–91)

## 2023-05-02 LAB — RPR: RPR Ser Ql: NONREACTIVE

## 2023-05-02 LAB — ANTIBODY SCREEN: Antibody Screen: NEGATIVE

## 2023-05-04 ENCOUNTER — Other Ambulatory Visit: Payer: Self-pay

## 2023-05-04 ENCOUNTER — Other Ambulatory Visit: Payer: Self-pay | Admitting: Women's Health

## 2023-05-04 ENCOUNTER — Encounter: Payer: Self-pay | Admitting: Women's Health

## 2023-05-04 ENCOUNTER — Other Ambulatory Visit (HOSPITAL_COMMUNITY): Payer: Self-pay

## 2023-05-04 MED ORDER — FERROUS SULFATE 325 (65 FE) MG PO TABS
325.0000 mg | ORAL_TABLET | ORAL | 2 refills | Status: DC
  Filled 2023-05-04: qty 45, 90d supply, fill #0
  Filled 2023-07-07: qty 45, 90d supply, fill #1

## 2023-05-07 ENCOUNTER — Ambulatory Visit (HOSPITAL_COMMUNITY)
Admission: RE | Admit: 2023-05-07 | Discharge: 2023-05-07 | Disposition: A | Discharge status: Home / Self Care | Payer: Commercial Managed Care - PPO | Source: Ambulatory Visit | Attending: Women's Health | Admitting: Women's Health

## 2023-05-07 DIAGNOSIS — Z3A19 19 weeks gestation of pregnancy: Secondary | ICD-10-CM | POA: Insufficient documentation

## 2023-05-07 DIAGNOSIS — R011 Cardiac murmur, unspecified: Secondary | ICD-10-CM | POA: Diagnosis not present

## 2023-05-07 DIAGNOSIS — I340 Nonrheumatic mitral (valve) insufficiency: Secondary | ICD-10-CM | POA: Insufficient documentation

## 2023-05-07 LAB — ECHOCARDIOGRAM COMPLETE
AR max vel: 2.06 cm2
AV Area VTI: 2.11 cm2
AV Area mean vel: 1.86 cm2
AV Mean grad: 6 mm[Hg]
AV Peak grad: 11.4 mm[Hg]
Ao pk vel: 1.69 m/s
Area-P 1/2: 4.86 cm2
S' Lateral: 2.9 cm

## 2023-05-10 ENCOUNTER — Encounter: Payer: Self-pay | Admitting: Women's Health

## 2023-05-12 ENCOUNTER — Encounter: Payer: Self-pay | Admitting: Women's Health

## 2023-05-21 ENCOUNTER — Encounter: Payer: Commercial Managed Care - PPO | Admitting: Women's Health

## 2023-05-21 ENCOUNTER — Ambulatory Visit: Payer: Commercial Managed Care - PPO | Admitting: Women's Health

## 2023-05-21 ENCOUNTER — Encounter: Payer: Self-pay | Admitting: Women's Health

## 2023-05-21 VITALS — BP 127/81 | HR 98 | Wt 175.2 lb

## 2023-05-21 DIAGNOSIS — O43103 Malformation of placenta, unspecified, third trimester: Secondary | ICD-10-CM

## 2023-05-21 DIAGNOSIS — O99891 Other specified diseases and conditions complicating pregnancy: Secondary | ICD-10-CM

## 2023-05-21 DIAGNOSIS — R8271 Bacteriuria: Secondary | ICD-10-CM

## 2023-05-21 DIAGNOSIS — Z3A30 30 weeks gestation of pregnancy: Secondary | ICD-10-CM

## 2023-05-21 DIAGNOSIS — O26893 Other specified pregnancy related conditions, third trimester: Secondary | ICD-10-CM

## 2023-05-21 DIAGNOSIS — Z6791 Unspecified blood type, Rh negative: Secondary | ICD-10-CM

## 2023-05-21 DIAGNOSIS — Z3403 Encounter for supervision of normal first pregnancy, third trimester: Secondary | ICD-10-CM

## 2023-05-21 NOTE — Progress Notes (Signed)
 LOW-RISK PREGNANCY VISIT Patient name: Zanobia Griebel MRN [id number]  Date of birth: [birth year] Chief Complaint:   Routine Prenatal Visit  History of Present Illness:   Sheletha Bow is a 28 y.o. G1P0000 female at [redacted]w[redacted]d with an Estimated Date of Delivery: 07/26/23 being seen today for ongoing management of a low-risk pregnancy.   Today she reports no complaints. Contractions: Not present. Vag. Bleeding: None.  Movement: Present. denies leaking of fluid.     01/12/2023    9:32 AM 11/11/2022   11:15 AM 07/07/2022   10:13 AM 11/07/2019    8:57 AM 04/24/2016    2:22 PM  Depression screen PHQ 2/9  Decreased Interest 0 0 0 0 0  Down, Depressed, Hopeless 0 0 0 0 0  PHQ - 2 Score 0 0 0 0 0  Altered sleeping 0 0 0 0   Tired, decreased energy 0 0 0 0   Change in appetite 0 0 0 0   Feeling bad or failure about yourself  0 0 0 0   Trouble concentrating 0 0 0 0   Moving slowly or fidgety/restless 0 0 0 0   Suicidal thoughts 0 0 0 0   PHQ-9 Score 0 0 0 0         01/12/2023    9:33 AM 11/11/2022   11:15 AM 07/07/2022   10:13 AM 11/07/2019    8:59 AM  GAD 7 : Generalized Anxiety Score  Nervous, Anxious, on Edge 0 1 1 0  Control/stop worrying 0 0 1 0  Worry too much - different things 0 0 1 0  Trouble relaxing 0 0 1 0  Restless 0 0 0 0  Easily annoyed or irritable 0 0 0 0  Afraid - awful might happen 0 0 0 0  Total GAD 7 Score 0 1 4 0  Anxiety Difficulty   Somewhat difficult       Review of Systems:   Pertinent items are noted in HPI Denies abnormal vaginal discharge w/ itching/odor/irritation, headaches, visual changes, shortness of breath, chest pain, abdominal pain, severe nausea/vomiting, or problems with urination or bowel movements unless otherwise stated above. Pertinent History Reviewed:  Reviewed past medical,surgical, social, obstetrical and family history.  Reviewed problem list, medications and allergies. Physical Assessment:   Vitals:   05/21/23 1421   BP: 127/81  Pulse: 98  Weight: 175 lb 3.2 oz (79.5 kg)  Body mass index is 31.04 kg/m.        Physical Examination:   General appearance: Well appearing, and in no distress  Mental status: Alert, oriented to person, place, and time  Skin: Warm & dry  Cardiovascular: Normal heart rate noted  Respiratory: Normal respiratory effort, no distress  Abdomen: Soft, gravid, nontender  Pelvic: Cervical exam deferred         Extremities: Edema: None  Fetal Status: Fetal Heart Rate (bpm): 135 Fundal Height: 31 cm Movement: Present    Chaperone: N/A No results found for this or any previous visit (from the past 24 hours).  Assessment & Plan:  1) Low-risk pregnancy G1P0000 at [redacted]w[redacted]d with an Estimated Date of Delivery: 07/26/23   2) H/O trivial MVR, echo 2/13 wnl  3) ?Placental thinning on u/s @ 19wks> rpt 32wks (scheduled for 3/21)  4) Anemia> hgb 9.9, taking Fe (sometimes more than every other day, maybe 5x/wk w/ OJ)   Meds: No orders of the defined types were placed in this encounter.  Labs/procedures today: Rhogam  Plan:  Continue routine obstetrical care  Next visit: prefers will be in person for u/s     Reviewed: Preterm labor symptoms and general obstetric precautions including but not limited to vaginal bleeding, contractions, leaking of fluid and fetal movement were reviewed in detail with the patient.  All questions were answered. Does have home bp cuff. Office bp cuff given: not applicable. Check bp weekly, let us know if consistently >140 and/or >90.  Follow-up: Return for As scheduled.  Future Appointments  Date Time Provider Department Center  06/12/2023  8:30 AM [facility] [place] [facility] None  06/12/2023  9:30 AM Eure, Amaryllis Dyke, MD [facility]    Orders Placed This Encounter  Procedures   US OB Follow Up   RHO (D) Immune Globulin   Cheral Marker CNM, [facility] 05/21/2023 2:51 PM

## 2023-05-21 NOTE — Patient Instructions (Signed)
Kristelle, thank you for choosing our office today! We appreciate the opportunity to meet your healthcare needs. You may receive a short survey by mail, e-mail, or through MyChart. If you are happy with your care we would appreciate if you could take just a few minutes to complete the survey questions. We read all of your comments and take your feedback very seriously. Thank you again for choosing our office.  [facility] ([address] [place], [place] [address]) Entrance C, located off of [address] Free 24/7 valet parking   CLASSES: Go to [email or web address] to register for classes (childbirth, breastfeeding, waterbirth, infant CPR, daddy bootcamp, etc.)  Call the office ([phone]) or go to [facility] if: You begin to have strong, frequent contractions Your water breaks.  Sometimes it is a big gush of fluid, sometimes it is just a trickle that keeps getting your panties wet or running down your legs You have vaginal bleeding.  It is normal to have a small amount of spotting if your cervix was checked.  You don't feel your baby moving like normal.  If you don't, get you something to eat and drink and lay down and focus on feeling your baby move.   If your baby is still not moving like normal, you should call the office or go to [facility].  Call the office ([phone]) or go to [facility] for these signs of pre-eclampsia: Severe headache that does not go away with Tylenol Visual changes- seeing spots, double, blurred vision Pain under your right breast or upper abdomen that does not go away with Tums or heartburn medicine Nausea and/or vomiting Severe swelling in your hands, feet, and face   Tdap Vaccine It is recommended that you get the Tdap vaccine during the third trimester of EACH pregnancy to help protect your baby from getting pertussis (whooping cough) 27-36 weeks is the BEST time to do  this so that you can pass the protection on to your baby. During pregnancy is better than after pregnancy, but if you are unable to get it during pregnancy it will be offered at the hospital.  You can get this vaccine with us, at the health department, your family doctor, or some local pharmacies Everyone who will be around your baby should also be up-to-date on their vaccines before the baby comes. Adults (who are not pregnant) only need 1 dose of Tdap during adulthood.   [place] Pediatricians/Family Doctors [facility] ([facility]): [address], [phone]           [facility]: [address], [phone]                [facility] ([facility]): [address], [phone] (call to ask if accepting patients) [facility]: [address], [place], [phone]    [place] Pediatricians/Family Doctors [facility] ([facility]): [address], [phone] [facility]: [address], [phone] [facility]: [address], [phone]  [place] [facility]  [facility] ([facility]): [phone] [facility]: [address], [phone]   [place] Family [facility]: [address], [phone]  [place] [facility]  [facility]: [address], [phone]  Home Blood Pressure Monitoring for Patients   Your provider has recommended that you check your   blood pressure (BP) at least once a week at home. If you do not have a blood pressure cuff at home, one will be provided for you. Contact your provider if you have not received your monitor within 1 week.   Helpful Tips for Accurate Home Blood Pressure Checks  Don't smoke, exercise, or drink caffeine 30 minutes before checking your BP Use the restroom before checking your BP (a full bladder can raise your  pressure) Relax in a comfortable upright chair Feet on the ground Left arm resting comfortably on a flat surface at the level of your heart Legs uncrossed Back supported Sit quietly and don't talk Place the cuff on your bare arm Adjust snuggly, so that only two fingertips can fit between your skin and the top of the cuff Check 2 readings separated by at least one minute Keep a log of your BP readings For a visual, please reference this diagram: http://ccnc.care/bpdiagram  Provider Name: [facility]     Phone: [phone]  Zone 1: ALL CLEAR  Continue to monitor your symptoms:  BP reading is less than 140 (top number) or less than 90 (bottom number)  No right upper stomach pain No headaches or seeing spots No feeling nauseated or throwing up No swelling in face and hands  Zone 2: CAUTION Call your doctor's office for any of the following:  BP reading is greater than 140 (top number) or greater than 90 (bottom number)  Stomach pain under your ribs in the middle or right side Headaches or seeing spots Feeling nauseated or throwing up Swelling in face and hands  Zone 3: EMERGENCY  Seek immediate medical care if you have any of the following:  BP reading is greater than160 (top number) or greater than 110 (bottom number) Severe headaches not improving with Tylenol Serious difficulty catching your breath Any worsening symptoms from Zone 2  Preterm Labor and Birth Information  The normal length of a pregnancy is 39-41 weeks. Preterm labor is when labor starts before 37 completed weeks of pregnancy. What are the risk factors for preterm labor? Preterm labor is more likely to occur in women who: Have certain infections during pregnancy such as a bladder infection, sexually transmitted infection, or infection inside the uterus (chorioamnionitis). Have a shorter-than-normal cervix. Have gone into preterm labor before. Have had surgery on their cervix. Are younger than age 17  or older than age 35. Are African American. Are pregnant with twins or multiple babies (multiple gestation). Take street drugs or smoke while pregnant. Do not gain enough weight while pregnant. Became pregnant shortly after having been pregnant. What are the symptoms of preterm labor? Symptoms of preterm labor include: Cramps similar to those that can happen during a menstrual period. The cramps may happen with diarrhea. Pain in the abdomen or lower back. Regular uterine contractions that may feel like tightening of the abdomen. A feeling of increased pressure in the pelvis. Increased watery or bloody mucus discharge from the vagina. Water breaking (ruptured amniotic sac). Why is it important to recognize signs of preterm labor? It is important to recognize signs of preterm labor because babies who are born prematurely may not be fully developed. This can put them at an increased risk for: Long-term (chronic) heart and lung problems. Difficulty immediately after birth with regulating body systems, including blood sugar, body temperature, heart rate, and breathing rate. Bleeding in the brain. Cerebral palsy. Learning difficulties. Death. These risks are highest for babies who are born before 34 weeks   of pregnancy. How is preterm labor treated? Treatment depends on the length of your pregnancy, your condition, and the health of your baby. It may involve: Having a stitch (suture) placed in your cervix to prevent your cervix from opening too early (cerclage). Taking or being given medicines, such as: Hormone medicines. These may be given early in pregnancy to help support the pregnancy. Medicine to stop contractions. Medicines to help mature the baby's lungs. These may be prescribed if the risk of delivery is high. Medicines to prevent your baby from developing cerebral palsy. If the labor happens before 34 weeks of pregnancy, you may need to stay in the hospital. What should I do if I  think I am in preterm labor? If you think that you are going into preterm labor, call your health care provider right away. How can I prevent preterm labor in future pregnancies? To increase your chance of having a full-term pregnancy: Do not use any tobacco products, such as cigarettes, chewing tobacco, and e-cigarettes. If you need help quitting, ask your health care provider. Do not use street drugs or medicines that have not been prescribed to you during your pregnancy. Talk with your health care provider before taking any herbal supplements, even if you have been taking them regularly. Make sure you gain a healthy amount of weight during your pregnancy. Watch for infection. If you think that you might have an infection, get it checked right away. Make sure to tell your health care provider if you have gone into preterm labor before. This information is not intended to replace advice given to you by your health care provider. Make sure you discuss any questions you have with your health care provider. Document Revised: 07/02/2018 Document Reviewed: 08/01/2015 Elsevier Patient Education  2020 Elsevier Inc.   

## 2023-05-28 ENCOUNTER — Encounter: Payer: Self-pay | Admitting: Women's Health

## 2023-05-29 ENCOUNTER — Other Ambulatory Visit: Payer: Self-pay

## 2023-06-08 ENCOUNTER — Other Ambulatory Visit: Payer: Self-pay | Admitting: Family Medicine

## 2023-06-08 ENCOUNTER — Encounter: Payer: Self-pay | Admitting: Family Medicine

## 2023-06-08 MED ORDER — VALACYCLOVIR HCL 1 G PO TABS: ORAL_TABLET | ORAL | 12 refills | Status: DC

## 2023-06-11 ENCOUNTER — Encounter: Payer: Self-pay | Admitting: Women's Health

## 2023-06-12 ENCOUNTER — Ambulatory Visit: Payer: Commercial Managed Care - PPO | Admitting: Obstetrics & Gynecology

## 2023-06-12 ENCOUNTER — Encounter: Payer: Self-pay | Admitting: Obstetrics & Gynecology

## 2023-06-12 ENCOUNTER — Ambulatory Visit: Payer: Commercial Managed Care - PPO

## 2023-06-12 VITALS — BP 116/75 | HR 106 | Wt 176.0 lb

## 2023-06-12 DIAGNOSIS — Z3A33 33 weeks gestation of pregnancy: Secondary | ICD-10-CM | POA: Diagnosis not present

## 2023-06-12 DIAGNOSIS — Z3403 Encounter for supervision of normal first pregnancy, third trimester: Secondary | ICD-10-CM

## 2023-06-12 DIAGNOSIS — O43103 Malformation of placenta, unspecified, third trimester: Secondary | ICD-10-CM

## 2023-06-12 NOTE — Progress Notes (Signed)
   LOW-RISK PREGNANCY VISIT Patient name: Veronica Allen MRN [id number]  Date of birth: 10-May-1995 Chief Complaint:   Routine Prenatal Visit  History of Present Illness:   Veronica Allen is a 28 y.o. G1P0000 female at [redacted]w[redacted]d with an Estimated Date of Delivery: 07/26/23 being seen today for ongoing management of a low-risk pregnancy.     01/12/2023    9:32 AM 11/11/2022   11:15 AM 07/07/2022   10:13 AM 11/07/2019    8:57 AM 04/24/2016    2:22 PM  Depression screen PHQ 2/9  Decreased Interest 0 0 0 0 0  Down, Depressed, Hopeless 0 0 0 0 0  PHQ - 2 Score 0 0 0 0 0  Altered sleeping 0 0 0 0   Tired, decreased energy 0 0 0 0   Change in appetite 0 0 0 0   Feeling bad or failure about yourself  0 0 0 0   Trouble concentrating 0 0 0 0   Moving slowly or fidgety/restless 0 0 0 0   Suicidal thoughts 0 0 0 0   PHQ-9 Score 0 0 0 0     Today she reports no complaints. Contractions: Not present. Vag. Bleeding: None.  Movement: Present. denies leaking of fluid. Review of Systems:   Pertinent items are noted in HPI Denies abnormal vaginal discharge w/ itching/odor/irritation, headaches, visual changes, shortness of breath, chest pain, abdominal pain, severe nausea/vomiting, or problems with urination or bowel movements unless otherwise stated above. Pertinent History Reviewed:  Reviewed past medical,surgical, social, obstetrical and family history.  Reviewed problem list, medications and allergies. Physical Assessment:   Vitals:   06/12/23 0903 06/12/23 0940  BP: (!) 143/86 116/75  Pulse: 71 (!) 106  Weight: 176 lb (79.8 kg)   Body mass index is 31.18 kg/m.        Physical Examination:   General appearance: Well appearing, and in no distress  Mental status: Alert, oriented to person, place, and time  Skin: Warm & dry  Cardiovascular: Normal heart rate noted  Respiratory: Normal respiratory effort, no distress  Abdomen: Soft, gravid, nontender  Pelvic: Cervical exam deferred          Extremities:    Fetal Status:     Movement: Present    Chaperone: n/a    No results found for this or any previous visit (from the past 24 hours).  Assessment & Plan:  1) Low-risk pregnancy G1P0000 at [redacted]w[redacted]d with an Estimated Date of Delivery: 07/26/23   2) EFW 98%, which made her BP go up a bit   Meds: No orders of the defined types were placed in this encounter.  Labs/procedures today: sonogram  Plan:  Continue routine obstetrical care  Next visit: prefers in person    Reviewed: Preterm labor symptoms and general obstetric precautions including but not limited to vaginal bleeding, contractions, leaking of fluid and fetal movement were reviewed in detail with the patient.  All questions were answered. Has home bp cuff. Rx faxed to . Check bp weekly, let us know if >140/90.   Follow-up: Return in about 2 weeks (around 06/26/2023) for LROB, 3 weeks make sonogram appt for EFW.  No orders of the defined types were placed in this encounter.   Lazaro Arms, MD 06/12/2023 10:12 AM

## 2023-06-12 NOTE — Progress Notes (Signed)
 [place] 33+5 wks,cephalic,posterior placenta gr 1,AFI 14 cm,FHR 140 bpm,EFW 2959 g 98%

## 2023-06-15 ENCOUNTER — Other Ambulatory Visit (HOSPITAL_COMMUNITY): Payer: Self-pay

## 2023-06-25 ENCOUNTER — Ambulatory Visit: Admitting: Women's Health

## 2023-06-25 ENCOUNTER — Encounter: Payer: Self-pay | Admitting: Women's Health

## 2023-06-25 VITALS — BP 129/84 | HR 105 | Wt 181.0 lb

## 2023-06-25 DIAGNOSIS — O99013 Anemia complicating pregnancy, third trimester: Secondary | ICD-10-CM | POA: Diagnosis not present

## 2023-06-25 DIAGNOSIS — O3663X0 Maternal care for excessive fetal growth, third trimester, not applicable or unspecified: Secondary | ICD-10-CM

## 2023-06-25 DIAGNOSIS — Z3403 Encounter for supervision of normal first pregnancy, third trimester: Secondary | ICD-10-CM

## 2023-06-25 DIAGNOSIS — Z3A35 35 weeks gestation of pregnancy: Secondary | ICD-10-CM

## 2023-06-25 LAB — POCT HEMOGLOBIN: Hemoglobin: 10.6 g/dL — AB (ref 11–14.6)

## 2023-06-25 NOTE — Progress Notes (Signed)
 LOW-RISK PREGNANCY VISIT Patient name: Veronica Allen MRN [id number]  Date of birth: [birth year] Chief Complaint:   Routine Prenatal Visit  History of Present Illness:   Veronica Allen is a 28 y.o. G1P0000 female at [redacted]w[redacted]d with an Estimated Date of Delivery: 07/26/23 being seen today for ongoing management of a low-risk pregnancy.   Today she reports occasional contractions. Contractions: Not present. Vag. Bleeding: None.  Movement: Present. denies leaking of fluid.     01/12/2023    9:32 AM 11/11/2022   11:15 AM 07/07/2022   10:13 AM 11/07/2019    8:57 AM 04/24/2016    2:22 PM  Depression screen PHQ 2/9  Decreased Interest 0 0 0 0 0  Down, Depressed, Hopeless 0 0 0 0 0  PHQ - 2 Score 0 0 0 0 0  Altered sleeping 0 0 0 0   Tired, decreased energy 0 0 0 0   Change in appetite 0 0 0 0   Feeling bad or failure about yourself  0 0 0 0   Trouble concentrating 0 0 0 0   Moving slowly or fidgety/restless 0 0 0 0   Suicidal thoughts 0 0 0 0   PHQ-9 Score 0 0 0 0         01/12/2023    9:33 AM 11/11/2022   11:15 AM 07/07/2022   10:13 AM 11/07/2019    8:59 AM  GAD 7 : Generalized Anxiety Score  Nervous, Anxious, on Edge 0 1 1 0  Control/stop worrying 0 0 1 0  Worry too much - different things 0 0 1 0  Trouble relaxing 0 0 1 0  Restless 0 0 0 0  Easily annoyed or irritable 0 0 0 0  Afraid - awful might happen 0 0 0 0  Total GAD 7 Score 0 1 4 0  Anxiety Difficulty   Somewhat difficult       Review of Systems:   Pertinent items are noted in HPI Denies abnormal vaginal discharge w/ itching/odor/irritation, headaches, visual changes, shortness of breath, chest pain, abdominal pain, severe nausea/vomiting, or problems with urination or bowel movements unless otherwise stated above. Pertinent History Reviewed:  Reviewed past medical,surgical, social, obstetrical and family history.  Reviewed problem list, medications and allergies. Physical Assessment:   Vitals:   06/25/23  1413  BP: 129/84  Pulse: (!) 105  Weight: 181 lb (82.1 kg)  Body mass index is 32.06 kg/m.        Physical Examination:   General appearance: Well appearing, and in no distress  Mental status: Alert, oriented to person, place, and time  Skin: Warm & dry  Cardiovascular: Normal heart rate noted  Respiratory: Normal respiratory effort, no distress  Abdomen: Soft, gravid, nontender  Pelvic: Cervical exam deferred         Extremities: Edema: None  Fetal Status: Fetal Heart Rate (bpm): 142 Fundal Height: 35 cm Movement: Present    Chaperone: N/A Results for orders placed or performed in visit on 06/25/23 (from the past 24 hours)  POCT hemoglobin   Collection Time: 06/25/23  2:13 PM  Result Value Ref Range   Hemoglobin 10.6 (A) 11 - 14.6 g/dL    Assessment & Plan:  1) Low-risk pregnancy G1P0000 at [redacted]w[redacted]d with an Estimated Date of Delivery: 07/26/23   2) Suspected LGA, EFW 98% @ 33w, repeat next week as scheduled  3) Anemia> hgb up to 10.6 today from 9.9, continue Fe  4) Trivial MVR> echo  wnl   Meds: No orders of the defined types were placed in this encounter.  Labs/procedures today: none  Plan:  Continue routine obstetrical care  Next visit: prefers will be in person for u/s and cultures     Reviewed: Preterm labor symptoms and general obstetric precautions including but not limited to vaginal bleeding, contractions, leaking of fluid and fetal movement were reviewed in detail with the patient.  All questions were answered. Does have home bp cuff. Office bp cuff given: not applicable. Check bp daily, let us know if consistently >140 and/or >90, reviewed pre-e s/s, reasons to seek care.  Follow-up: Return for As scheduled.  Future Appointments  Date Time Provider Department Center  07/03/2023  8:30 AM [facility] [place] [facility] None  07/03/2023  9:50 AM Lazaro Arms, MD [facility]  11/12/2023  2:30 PM Babs Sciara, MD [facility]    Orders Placed This Encounter   Procedures   US OB Follow Up   POCT hemoglobin   Cheral Marker CNM, [facility] 06/25/2023 2:26 PM

## 2023-06-25 NOTE — Patient Instructions (Signed)
Kristelle, thank you for choosing our office today! We appreciate the opportunity to meet your healthcare needs. You may receive a short survey by mail, e-mail, or through MyChart. If you are happy with your care we would appreciate if you could take just a few minutes to complete the survey questions. We read all of your comments and take your feedback very seriously. Thank you again for choosing our office.  [facility] ([address] [place], [place] [address]) Entrance C, located off of [address] Free 24/7 valet parking   CLASSES: Go to [email or web address] to register for classes (childbirth, breastfeeding, waterbirth, infant CPR, daddy bootcamp, etc.)  Call the office ([phone]) or go to [facility] if: You begin to have strong, frequent contractions Your water breaks.  Sometimes it is a big gush of fluid, sometimes it is just a trickle that keeps getting your panties wet or running down your legs You have vaginal bleeding.  It is normal to have a small amount of spotting if your cervix was checked.  You don't feel your baby moving like normal.  If you don't, get you something to eat and drink and lay down and focus on feeling your baby move.   If your baby is still not moving like normal, you should call the office or go to [facility].  Call the office ([phone]) or go to [facility] for these signs of pre-eclampsia: Severe headache that does not go away with Tylenol Visual changes- seeing spots, double, blurred vision Pain under your right breast or upper abdomen that does not go away with Tums or heartburn medicine Nausea and/or vomiting Severe swelling in your hands, feet, and face   Tdap Vaccine It is recommended that you get the Tdap vaccine during the third trimester of EACH pregnancy to help protect your baby from getting pertussis (whooping cough) 27-36 weeks is the BEST time to do  this so that you can pass the protection on to your baby. During pregnancy is better than after pregnancy, but if you are unable to get it during pregnancy it will be offered at the hospital.  You can get this vaccine with us, at the health department, your family doctor, or some local pharmacies Everyone who will be around your baby should also be up-to-date on their vaccines before the baby comes. Adults (who are not pregnant) only need 1 dose of Tdap during adulthood.   [place] Pediatricians/Family Doctors [facility] ([facility]): [address], [phone]           [facility]: [address], [phone]                [facility] ([facility]): [address], [phone] (call to ask if accepting patients) [facility]: [address], [place], [phone]    [place] Pediatricians/Family Doctors [facility] ([facility]): [address], [phone] [facility]: [address], [phone] [facility]: [address], [phone]  [place] [facility]  [facility] ([facility]): [phone] [facility]: [address], [phone]   [place] Family [facility]: [address], [phone]  [place] [facility]  [facility]: [address], [phone]  Home Blood Pressure Monitoring for Patients   Your provider has recommended that you check your   blood pressure (BP) at least once a week at home. If you do not have a blood pressure cuff at home, one will be provided for you. Contact your provider if you have not received your monitor within 1 week.   Helpful Tips for Accurate Home Blood Pressure Checks  Don't smoke, exercise, or drink caffeine 30 minutes before checking your BP Use the restroom before checking your BP (a full bladder can raise your  pressure) Relax in a comfortable upright chair Feet on the ground Left arm resting comfortably on a flat surface at the level of your heart Legs uncrossed Back supported Sit quietly and don't talk Place the cuff on your bare arm Adjust snuggly, so that only two fingertips can fit between your skin and the top of the cuff Check 2 readings separated by at least one minute Keep a log of your BP readings For a visual, please reference this diagram: http://ccnc.care/bpdiagram  Provider Name: [facility]     Phone: [phone]  Zone 1: ALL CLEAR  Continue to monitor your symptoms:  BP reading is less than 140 (top number) or less than 90 (bottom number)  No right upper stomach pain No headaches or seeing spots No feeling nauseated or throwing up No swelling in face and hands  Zone 2: CAUTION Call your doctor's office for any of the following:  BP reading is greater than 140 (top number) or greater than 90 (bottom number)  Stomach pain under your ribs in the middle or right side Headaches or seeing spots Feeling nauseated or throwing up Swelling in face and hands  Zone 3: EMERGENCY  Seek immediate medical care if you have any of the following:  BP reading is greater than160 (top number) or greater than 110 (bottom number) Severe headaches not improving with Tylenol Serious difficulty catching your breath Any worsening symptoms from Zone 2  Preterm Labor and Birth Information  The normal length of a pregnancy is 39-41 weeks. Preterm labor is when labor starts before 37 completed weeks of pregnancy. What are the risk factors for preterm labor? Preterm labor is more likely to occur in women who: Have certain infections during pregnancy such as a bladder infection, sexually transmitted infection, or infection inside the uterus (chorioamnionitis). Have a shorter-than-normal cervix. Have gone into preterm labor before. Have had surgery on their cervix. Are younger than age 17  or older than age 35. Are African American. Are pregnant with twins or multiple babies (multiple gestation). Take street drugs or smoke while pregnant. Do not gain enough weight while pregnant. Became pregnant shortly after having been pregnant. What are the symptoms of preterm labor? Symptoms of preterm labor include: Cramps similar to those that can happen during a menstrual period. The cramps may happen with diarrhea. Pain in the abdomen or lower back. Regular uterine contractions that may feel like tightening of the abdomen. A feeling of increased pressure in the pelvis. Increased watery or bloody mucus discharge from the vagina. Water breaking (ruptured amniotic sac). Why is it important to recognize signs of preterm labor? It is important to recognize signs of preterm labor because babies who are born prematurely may not be fully developed. This can put them at an increased risk for: Long-term (chronic) heart and lung problems. Difficulty immediately after birth with regulating body systems, including blood sugar, body temperature, heart rate, and breathing rate. Bleeding in the brain. Cerebral palsy. Learning difficulties. Death. These risks are highest for babies who are born before 34 weeks   of pregnancy. How is preterm labor treated? Treatment depends on the length of your pregnancy, your condition, and the health of your baby. It may involve: Having a stitch (suture) placed in your cervix to prevent your cervix from opening too early (cerclage). Taking or being given medicines, such as: Hormone medicines. These may be given early in pregnancy to help support the pregnancy. Medicine to stop contractions. Medicines to help mature the baby's lungs. These may be prescribed if the risk of delivery is high. Medicines to prevent your baby from developing cerebral palsy. If the labor happens before 34 weeks of pregnancy, you may need to stay in the hospital. What should I do if I  think I am in preterm labor? If you think that you are going into preterm labor, call your health care provider right away. How can I prevent preterm labor in future pregnancies? To increase your chance of having a full-term pregnancy: Do not use any tobacco products, such as cigarettes, chewing tobacco, and e-cigarettes. If you need help quitting, ask your health care provider. Do not use street drugs or medicines that have not been prescribed to you during your pregnancy. Talk with your health care provider before taking any herbal supplements, even if you have been taking them regularly. Make sure you gain a healthy amount of weight during your pregnancy. Watch for infection. If you think that you might have an infection, get it checked right away. Make sure to tell your health care provider if you have gone into preterm labor before. This information is not intended to replace advice given to you by your health care provider. Make sure you discuss any questions you have with your health care provider. Document Revised: 07/02/2018 Document Reviewed: 08/01/2015 Elsevier Patient Education  2020 Elsevier Inc.   

## 2023-06-26 ENCOUNTER — Encounter: Payer: Self-pay | Admitting: *Deleted

## 2023-06-29 ENCOUNTER — Other Ambulatory Visit (HOSPITAL_COMMUNITY): Payer: Self-pay

## 2023-07-03 ENCOUNTER — Ambulatory Visit

## 2023-07-03 ENCOUNTER — Encounter: Payer: Self-pay | Admitting: Obstetrics & Gynecology

## 2023-07-03 ENCOUNTER — Ambulatory Visit: Admitting: Obstetrics & Gynecology

## 2023-07-03 ENCOUNTER — Other Ambulatory Visit (HOSPITAL_COMMUNITY)
Admission: RE | Admit: 2023-07-03 | Discharge: 2023-07-03 | Disposition: A | Discharge status: Home / Self Care | Source: Ambulatory Visit | Attending: Obstetrics & Gynecology | Admitting: Obstetrics & Gynecology

## 2023-07-03 VITALS — BP 115/78 | HR 74 | Wt 179.0 lb

## 2023-07-03 DIAGNOSIS — Z3403 Encounter for supervision of normal first pregnancy, third trimester: Secondary | ICD-10-CM | POA: Insufficient documentation

## 2023-07-03 DIAGNOSIS — Z3A36 36 weeks gestation of pregnancy: Secondary | ICD-10-CM

## 2023-07-03 DIAGNOSIS — Z113 Encounter for screening for infections with a predominantly sexual mode of transmission: Secondary | ICD-10-CM | POA: Diagnosis present

## 2023-07-03 DIAGNOSIS — O3663X0 Maternal care for excessive fetal growth, third trimester, not applicable or unspecified: Secondary | ICD-10-CM | POA: Diagnosis not present

## 2023-07-03 NOTE — Progress Notes (Signed)
 [place] 36+5 wks,cephalic,posterior fundal placenta gr 3,AFI 18 cm,FHR 144 bpm,EFW 3882 g 99%

## 2023-07-03 NOTE — Progress Notes (Signed)
   LOW-RISK PREGNANCY VISIT Patient name: Veronica Allen MRN [id number]  Date of birth: [birth year] Chief Complaint:   Routine Prenatal Visit  History of Present Illness:   Veronica Allen is a 28 y.o. G1P0000 female at [redacted]w[redacted]d with an Estimated Date of Delivery: 07/26/23 being seen today for ongoing management of a low-risk pregnancy.     01/12/2023    9:32 AM 11/11/2022   11:15 AM 07/07/2022   10:13 AM 11/07/2019    8:57 AM 04/24/2016    2:22 PM  Depression screen PHQ 2/9  Decreased Interest 0 0 0 0 0  Down, Depressed, Hopeless 0 0 0 0 0  PHQ - 2 Score 0 0 0 0 0  Altered sleeping 0 0 0 0   Tired, decreased energy 0 0 0 0   Change in appetite 0 0 0 0   Feeling bad or failure about yourself  0 0 0 0   Trouble concentrating 0 0 0 0   Moving slowly or fidgety/restless 0 0 0 0   Suicidal thoughts 0 0 0 0   PHQ-9 Score 0 0 0 0     Today she reports no complaints. Contractions: Irritability. Vag. Bleeding: None.  Movement: Present. denies leaking of fluid. Review of Systems:   Pertinent items are noted in HPI Denies abnormal vaginal discharge w/ itching/odor/irritation, headaches, visual changes, shortness of breath, chest pain, abdominal pain, severe nausea/vomiting, or problems with urination or bowel movements unless otherwise stated above. Pertinent History Reviewed:  Reviewed past medical,surgical, social, obstetrical and family history.  Reviewed problem list, medications and allergies. Physical Assessment:   Vitals:   07/03/23 0912  BP: 115/78  Pulse: 74  Weight: 179 lb (81.2 kg)  Body mass index is 31.71 kg/m.        Physical Examination:   General appearance: Well appearing, and in no distress  Mental status: Alert, oriented to person, place, and time  Skin: Warm & dry  Cardiovascular: Normal heart rate noted  Respiratory: Normal respiratory effort, no distress  Abdomen: Soft, gravid, nontender  Pelvic: 2/50/-2         Extremities:    Fetal Status:      Movement: Present    Chaperone: Latisha Cresenzo    No results found for this or any previous visit (from the past 24 hours).  Assessment & Plan:  1) Low-risk pregnancy G1P0000 at [redacted]w[redacted]d with an Estimated Date of Delivery: 07/26/23   2) LGA fetal weight,    Meds: No orders of the defined types were placed in this encounter.  Labs/procedures today: sonogram  Plan:  Continue routine obstetrical care  Next visit: prefers in person    Reviewed: Term labor symptoms and general obstetric precautions including but not limited to vaginal bleeding, contractions, leaking of fluid and fetal movement were reviewed in detail with the patient.  All questions were answered. has home bp cuff. Rx faxed to . Check bp weekly, let us  know if >140/90.   Follow-up: Return in about 10 days (around 07/13/2023) for with Kim(membrane sweep). IOL 39 weeks  Orders Placed This Encounter  Procedures   Culture, beta strep (group b only)    Wendelyn Halter, MD 07/05/2023 4:45 PM

## 2023-07-05 LAB — CERVICOVAGINAL ANCILLARY ONLY
Chlamydia: NEGATIVE
Comment: NEGATIVE
Comment: NORMAL
Neisseria Gonorrhea: NEGATIVE

## 2023-07-07 LAB — CULTURE, BETA STREP (GROUP B ONLY): Strep Gp B Culture: NEGATIVE

## 2023-07-08 ENCOUNTER — Other Ambulatory Visit (HOSPITAL_COMMUNITY): Payer: Self-pay

## 2023-07-09 ENCOUNTER — Encounter: Payer: Self-pay | Admitting: Obstetrics & Gynecology

## 2023-07-13 ENCOUNTER — Encounter: Payer: Self-pay | Admitting: Women's Health

## 2023-07-13 ENCOUNTER — Ambulatory Visit: Admitting: Women's Health

## 2023-07-13 VITALS — BP 135/85 | HR 99 | Wt 183.2 lb

## 2023-07-13 DIAGNOSIS — O3663X0 Maternal care for excessive fetal growth, third trimester, not applicable or unspecified: Secondary | ICD-10-CM | POA: Diagnosis not present

## 2023-07-13 DIAGNOSIS — Z3403 Encounter for supervision of normal first pregnancy, third trimester: Secondary | ICD-10-CM

## 2023-07-13 DIAGNOSIS — Z3A38 38 weeks gestation of pregnancy: Secondary | ICD-10-CM | POA: Diagnosis not present

## 2023-07-13 NOTE — Patient Instructions (Signed)
Rosina, thank you for choosing our office today! We appreciate the opportunity to meet your healthcare needs. You may receive a short survey by mail, e-mail, or through MyChart. If you are happy with your care we would appreciate if you could take just a few minutes to complete the survey questions. We read all of your comments and take your feedback very seriously. Thank you again for choosing our office.  [facility]  [facility] ([address] [place], [place] [address]) Entrance C, located off of [address] Free 24/7 valet parking   CLASSES: Go to [email or web address] to register for classes (childbirth, breastfeeding, waterbirth, infant CPR, daddy bootcamp, etc.)  Call the office ([phone]) or go to [facility] if: You begin to have strong, frequent contractions Your water breaks.  Sometimes it is a big gush of fluid, sometimes it is just a trickle that keeps getting your panties wet or running down your legs You have vaginal bleeding.  It is normal to have a small amount of spotting if your cervix was checked.  You don't feel your baby moving like normal.  If you don't, get you something to eat and drink and lay down and focus on feeling your baby move.   If your baby is still not moving like normal, you should call the office or go to [facility].  Call the office ([phone]) or go to [facility] for these signs of pre-eclampsia: Severe headache that does not go away with Tylenol Visual changes- seeing spots, double, blurred vision Pain under your right breast or upper abdomen that does not go away with Tums or heartburn medicine Nausea and/or vomiting Severe swelling in your hands, feet, and face   [place] [facility] ([facility]): [address], [phone]           [facility]: [address], [phone]                 [facility] ([facility]): [address], [phone] (call to ask if accepting patients) [facility]: [address], [place], [phone]    [place] [facility] Doctors [facility] ([facility]): [address], [phone] [facility]: [address], [phone] [facility]: [address], [phone]  [place] [facility]  [facility] ([facility]): [phone] [facility]: [address], [phone]   [place] [facility]: [address], [phone]  [place] [facility]  [facility]: [address], [phone]  Home Blood Pressure Monitoring for Patients   Your provider has recommended that you check your blood pressure (BP) at least once a week at home. If you do not have a blood pressure cuff at home, one will be provided for you. Contact your provider if you have not received your monitor within 1 week.   Helpful Tips for Accurate Home Blood Pressure Checks  Don't smoke, exercise, or drink caffeine 30 minutes before checking your BP Use the restroom before checking your BP (a full bladder can raise your pressure) Relax in a comfortable upright chair Feet on the ground Left arm resting comfortably on a flat surface at the level of your heart Legs uncrossed Back supported Sit quietly and don't talk Place the cuff on your bare arm Adjust snuggly, so that only two fingertips   can fit between your skin and the top of the cuff Check 2 readings separated by at least one minute Keep a log of your BP readings For a visual, please reference this diagram: http://ccnc.care/bpdiagram  Provider Name: [facility]     Phone: [phone]  Zone 1: ALL CLEAR  Continue to monitor your symptoms:  BP reading is less than 140 (top number) or less than 90 (bottom number)  No right  upper stomach pain No headaches or seeing spots No feeling nauseated or throwing up No swelling in face and hands  Zone 2: CAUTION Call your doctor's office for any of the following:  BP reading is greater than 140 (top number) or greater than 90 (bottom number)  Stomach pain under your ribs in the middle or right side Headaches or seeing spots Feeling nauseated or throwing up Swelling in face and hands  Zone 3: EMERGENCY  Seek immediate medical care if you have any of the following:  BP reading is greater than160 (top number) or greater than 110 (bottom number) Severe headaches not improving with Tylenol Serious difficulty catching your breath Any worsening symptoms from Zone 2   Braxton Hicks Contractions Contractions of the uterus can occur throughout pregnancy, but they are not always a sign that you are in labor. You may have practice contractions called Braxton Hicks contractions. These false labor contractions are sometimes confused with true labor. What are [place] contractions? Braxton Hicks contractions are tightening movements that occur in the muscles of the uterus before labor. Unlike true labor contractions, these contractions do not result in opening (dilation) and thinning of the cervix. Toward the end of pregnancy (32-34 weeks), Braxton Hicks contractions can happen more often and may become stronger. These contractions are sometimes difficult to tell apart from true labor because they can be very uncomfortable. You should not feel embarrassed if you go to the hospital with false labor. Sometimes, the only way to tell if you are in true labor is for your health care provider to look for changes in the cervix. The health care provider will do a physical exam and may monitor your contractions. If you are not in true labor, the exam should show that your cervix is not dilating and your water has not broken. If there are no other health problems associated with your  pregnancy, it is completely safe for you to be sent home with false labor. You may continue to have Braxton Hicks contractions until you go into true labor. How to tell the difference between true labor and false labor True labor Contractions last 30-70 seconds. Contractions become very regular. Discomfort is usually felt in the top of the uterus, and it spreads to the lower abdomen and low back. Contractions do not go away with walking. Contractions usually become more intense and increase in frequency. The cervix dilates and gets thinner. False labor Contractions are usually shorter and not as strong as true labor contractions. Contractions are usually irregular. Contractions are often felt in the front of the lower abdomen and in the groin. Contractions may go away when you walk around or change positions while lying down. Contractions get weaker and are shorter-lasting as time goes on. The cervix usually does not dilate or become thin. Follow these instructions at home:  Take over-the-counter and prescription medicines only as told by your health care provider. Keep up with your usual exercises and follow other instructions from your health care provider. Eat and drink lightly if you think  you are going into labor. If Braxton Hicks contractions are making you uncomfortable: Change your position from lying down or resting to walking, or change from walking to resting. Sit and rest in a tub of warm water. Drink enough fluid to keep your urine pale yellow. Dehydration may cause these contractions. Do slow and deep breathing several times an hour. Keep all follow-up prenatal visits as told by your health care provider. This is important. Contact a health care provider if: You have a fever. You have continuous pain in your abdomen. Get help right away if: Your contractions become stronger, more regular, and closer together. You have fluid leaking or gushing from your vagina. You pass  blood-tinged mucus (bloody show). You have bleeding from your vagina. You have low back pain that you never had before. You feel your baby's head pushing down and causing pelvic pressure. Your baby is not moving inside you as much as it used to. Summary Contractions that occur before labor are called Braxton Hicks contractions, false labor, or practice contractions. Braxton Hicks contractions are usually shorter, weaker, farther apart, and less regular than true labor contractions. True labor contractions usually become progressively stronger and regular, and they become more frequent. Manage discomfort from [facility] contractions by changing position, resting in a warm bath, drinking plenty of water, or practicing deep breathing. This information is not intended to replace advice given to you by your health care provider. Make sure you discuss any questions you have with your health care provider. Document Revised: 02/20/2017 Document Reviewed: 07/24/2016 Elsevier Patient Education  [place].

## 2023-07-13 NOTE — Progress Notes (Signed)
 LOW-RISK PREGNANCY VISIT Patient name: Veronica Allen MRN [id number]  Date of birth: 1995-08-28 Chief Complaint:   Routine Prenatal Visit  History of Present Illness:   Veronica Allen is a 28 y.o. G1P0000 female at [redacted]w[redacted]d with an Estimated Date of Delivery: 07/26/23 being seen today for ongoing management of a low-risk pregnancy.   Today she reports no complaints. Walked over from hospital for todays appt. Home bp's wnl. Denies ha, visual changes, ruq/epigastric pain, n/v.   Contractions: Irritability. Vag. Bleeding: None.  Movement: Present. denies leaking of fluid.     01/12/2023    9:32 AM 11/11/2022   11:15 AM 07/07/2022   10:13 AM 11/07/2019    8:57 AM 04/24/2016    2:22 PM  Depression screen PHQ 2/9  Decreased Interest 0 0 0 0 0  Down, Depressed, Hopeless 0 0 0 0 0  PHQ - 2 Score 0 0 0 0 0  Altered sleeping 0 0 0 0   Tired, decreased energy 0 0 0 0   Change in appetite 0 0 0 0   Feeling bad or failure about yourself  0 0 0 0   Trouble concentrating 0 0 0 0   Moving slowly or fidgety/restless 0 0 0 0   Suicidal thoughts 0 0 0 0   PHQ-9 Score 0 0 0 0         01/12/2023    9:33 AM 11/11/2022   11:15 AM 07/07/2022   10:13 AM 11/07/2019    8:59 AM  GAD 7 : Generalized Anxiety Score  Nervous, Anxious, on Edge 0 1 1 0  Control/stop worrying 0 0 1 0  Worry too much - different things 0 0 1 0  Trouble relaxing 0 0 1 0  Restless 0 0 0 0  Easily annoyed or irritable 0 0 0 0  Afraid - awful might happen 0 0 0 0  Total GAD 7 Score 0 1 4 0  Anxiety Difficulty   Somewhat difficult       Review of Systems:   Pertinent items are noted in HPI Denies abnormal vaginal discharge w/ itching/odor/irritation, headaches, visual changes, shortness of breath, chest pain, abdominal pain, severe nausea/vomiting, or problems with urination or bowel movements unless otherwise stated above. Pertinent History Reviewed:  Reviewed past medical,surgical, social, obstetrical and family  history.  Reviewed problem list, medications and allergies. Physical Assessment:   Vitals:   07/13/23 1342  BP: 135/85  Pulse: 99  Weight: 183 lb 3.2 oz (83.1 kg)  Body mass index is 32.45 kg/m.        Physical Examination:   General appearance: Well appearing, and in no distress  Mental status: Alert, oriented to person, place, and time  Skin: Warm & dry  Cardiovascular: Normal heart rate noted  Respiratory: Normal respiratory effort, no distress  Abdomen: Soft, gravid, nontender  Pelvic: Cervical exam performed  Dilation: 1.5 Effacement (%): 70 Station: -3  Extremities: Edema: None  Fetal Status: Fetal Heart Rate (bpm): 120 Fundal Height: 39 cm Movement: Present Presentation: Vertex  Chaperone: Lorean Rodes No results found for this or any previous visit (from the past 24 hours).  Assessment & Plan:  1) Low-risk pregnancy G1P0000 at [redacted]w[redacted]d with an Estimated Date of Delivery: 07/26/23   2) Suspected LGA 99%, 3882g @ 36.5w, extraps to 4500g at 39w, per LHE IOL 39w- put on eIOL list for 4/27, PDIOL 5/11.  IOL form faxed and orders placed   3) Trivial MVR>  echo wnl  4) Borderline bp> walked over from hospital for appt, asymptomatic, home bp's wnl. Check daily, if >140/90 or pre-e s/s, let us  know/go to [facility]   Meds: No orders of the defined types were placed in this encounter.  Labs/procedures today: SVE  Reviewed: Term labor symptoms and general obstetric precautions including but not limited to vaginal bleeding, contractions, leaking of fluid and fetal movement were reviewed in detail with the patient.  All questions were answered. Does have home bp cuff. Office bp cuff given: not applicable. Check bp daily, let us  know if consistently >140 and/or >90.  Follow-up: Return in about 1 week (around 07/20/2023) for LROB, CNM, in person. Just in case doesn't get called in for [facility] eIOL  Future Appointments  Date Time Provider Department Center  07/20/2023  3:10 PM Wendelyn Halter, MD  [facility]  08/02/2023  6:30 AM [facility] SCHED ROOM [facility] None  11/12/2023  2:30 PM Bennet Brasil, MD [facility]    No orders of the defined types were placed in this encounter.  Ferd Householder CNM, [facility] 07/13/2023 2:18 PM

## 2023-07-15 ENCOUNTER — Encounter: Payer: Self-pay | Admitting: Women's Health

## 2023-07-16 ENCOUNTER — Encounter (HOSPITAL_COMMUNITY): Payer: Self-pay | Admitting: *Deleted

## 2023-07-16 ENCOUNTER — Telehealth (HOSPITAL_COMMUNITY): Payer: Self-pay | Admitting: *Deleted

## 2023-07-16 NOTE — Telephone Encounter (Signed)
 Preadmission screen

## 2023-07-20 ENCOUNTER — Inpatient Hospital Stay (HOSPITAL_COMMUNITY)
Admission: AD | Admit: 2023-07-20 | Discharge: 2023-07-23 | DRG: 806 | Disposition: A | Discharge status: Home / Self Care | Attending: Obstetrics and Gynecology | Admitting: Obstetrics and Gynecology

## 2023-07-20 ENCOUNTER — Other Ambulatory Visit: Payer: Self-pay

## 2023-07-20 ENCOUNTER — Encounter (HOSPITAL_COMMUNITY): Payer: Self-pay | Admitting: Obstetrics and Gynecology

## 2023-07-20 ENCOUNTER — Ambulatory Visit (INDEPENDENT_AMBULATORY_CARE_PROVIDER_SITE_OTHER): Admitting: Obstetrics & Gynecology

## 2023-07-20 ENCOUNTER — Encounter: Payer: Self-pay | Admitting: Obstetrics & Gynecology

## 2023-07-20 VITALS — BP 136/88 | HR 144 | Wt 179.5 lb

## 2023-07-20 DIAGNOSIS — Z833 Family history of diabetes mellitus: Secondary | ICD-10-CM

## 2023-07-20 DIAGNOSIS — O134 Gestational [pregnancy-induced] hypertension without significant proteinuria, complicating childbirth: Secondary | ICD-10-CM | POA: Diagnosis not present

## 2023-07-20 DIAGNOSIS — I340 Nonrheumatic mitral (valve) insufficiency: Secondary | ICD-10-CM | POA: Diagnosis not present

## 2023-07-20 DIAGNOSIS — Z3A39 39 weeks gestation of pregnancy: Secondary | ICD-10-CM

## 2023-07-20 DIAGNOSIS — O3663X0 Maternal care for excessive fetal growth, third trimester, not applicable or unspecified: Secondary | ICD-10-CM | POA: Diagnosis not present

## 2023-07-20 DIAGNOSIS — Z6791 Unspecified blood type, Rh negative: Secondary | ICD-10-CM

## 2023-07-20 DIAGNOSIS — O9902 Anemia complicating childbirth: Secondary | ICD-10-CM | POA: Diagnosis present

## 2023-07-20 DIAGNOSIS — O3660X0 Maternal care for excessive fetal growth, unspecified trimester, not applicable or unspecified: Secondary | ICD-10-CM | POA: Diagnosis present

## 2023-07-20 DIAGNOSIS — O133 Gestational [pregnancy-induced] hypertension without significant proteinuria, third trimester: Principal | ICD-10-CM

## 2023-07-20 DIAGNOSIS — O9852 Other viral diseases complicating childbirth: Secondary | ICD-10-CM | POA: Diagnosis not present

## 2023-07-20 DIAGNOSIS — Z3403 Encounter for supervision of normal first pregnancy, third trimester: Secondary | ICD-10-CM

## 2023-07-20 DIAGNOSIS — B001 Herpesviral vesicular dermatitis: Secondary | ICD-10-CM | POA: Diagnosis present

## 2023-07-20 DIAGNOSIS — Z3A00 Weeks of gestation of pregnancy not specified: Secondary | ICD-10-CM | POA: Diagnosis not present

## 2023-07-20 DIAGNOSIS — O139 Gestational [pregnancy-induced] hypertension without significant proteinuria, unspecified trimester: Secondary | ICD-10-CM | POA: Diagnosis present

## 2023-07-20 LAB — COMPREHENSIVE METABOLIC PANEL WITH GFR
ALT: 12 U/L (ref 0–44)
AST: 18 U/L (ref 15–41)
Albumin: 2.7 g/dL — ABNORMAL LOW (ref 3.5–5.0)
Alkaline Phosphatase: 109 U/L (ref 38–126)
Anion gap: 10 (ref 5–15)
BUN: 7 mg/dL (ref 6–20)
CO2: 20 mmol/L — ABNORMAL LOW (ref 22–32)
Calcium: 8.9 mg/dL (ref 8.9–10.3)
Chloride: 107 mmol/L (ref 98–111)
Creatinine, Ser: 0.66 mg/dL (ref 0.44–1.00)
GFR, Estimated: >60 mL/min (ref >60)
Glucose, Bld: 85 mg/dL (ref 70–99)
Potassium: 3.9 mmol/L (ref 3.5–5.1)
Sodium: 137 mmol/L (ref 135–145)
Total Bilirubin: 0.8 mg/dL (ref 0.0–1.2)
Total Protein: 6 g/dL — ABNORMAL LOW (ref 6.5–8.1)

## 2023-07-20 LAB — CBC
HCT: 39.7 % (ref 36.0–46.0)
Hemoglobin: 13 g/dL (ref 12.0–15.0)
MCH: 28.8 pg (ref 26.0–34.0)
MCHC: 32.7 g/dL (ref 30.0–36.0)
MCV: 88 fL (ref 80.0–100.0)
Platelets: 336 10*3/uL (ref 150–400)
RBC: 4.51 MIL/uL (ref 3.87–5.11)
RDW: 14.7 % (ref 11.5–15.5)
WBC: 12.4 10*3/uL — ABNORMAL HIGH (ref 4.0–10.5)
nRBC: 0 % (ref 0.0–0.2)

## 2023-07-20 LAB — PROTEIN / CREATININE RATIO, URINE
Creatinine, Urine: 246 mg/dL
Protein Creatinine Ratio: 0.15 mg/mg{creat} (ref 0.00–0.15)
Total Protein, Urine: 36 mg/dL

## 2023-07-20 MED ORDER — FLEET ENEMA RE ENEM: 1.0000 | ENEMA | RECTAL | Status: DC | PRN

## 2023-07-20 MED ORDER — TERBUTALINE SULFATE 1 MG/ML IJ SOLN: 0.2500 mg | Freq: Once | INTRAMUSCULAR | Status: DC | PRN

## 2023-07-20 MED ORDER — OXYTOCIN BOLUS FROM INFUSION
333.0000 mL | Freq: Once | INTRAVENOUS | Status: AC
  Administered 2023-07-22: 333 mL via INTRAVENOUS

## 2023-07-20 MED ORDER — LIDOCAINE HCL (PF) 1 % IJ SOLN: 30.0000 mL | INTRAMUSCULAR | Status: DC | PRN

## 2023-07-20 MED ORDER — HYDROXYZINE HCL 50 MG PO TABS: 50.0000 mg | ORAL_TABLET | Freq: Four times a day (QID) | ORAL | Status: DC | PRN

## 2023-07-20 MED ORDER — OXYCODONE-ACETAMINOPHEN 5-325 MG PO TABS: 2.0000 | ORAL_TABLET | ORAL | Status: DC | PRN

## 2023-07-20 MED ORDER — OXYTOCIN-SODIUM CHLORIDE 30-0.9 UT/500ML-% IV SOLN: 2.5000 [IU]/h | INTRAVENOUS | Status: DC

## 2023-07-20 MED ORDER — ACETAMINOPHEN 325 MG PO TABS
650.0000 mg | ORAL_TABLET | ORAL | Status: DC | PRN
  Administered 2023-07-22: 650 mg via ORAL
  Filled 2023-07-20: qty 2

## 2023-07-20 MED ORDER — SOD CITRATE-CITRIC ACID 500-334 MG/5ML PO SOLN: 30.0000 mL | ORAL | Status: DC | PRN

## 2023-07-20 MED ORDER — FENTANYL CITRATE (PF) 100 MCG/2ML IJ SOLN
100.0000 ug | INTRAMUSCULAR | Status: DC | PRN
  Administered 2023-07-21 (×2): 100 ug via INTRAVENOUS
  Filled 2023-07-20 (×3): qty 2

## 2023-07-20 MED ORDER — OXYCODONE-ACETAMINOPHEN 5-325 MG PO TABS: 1.0000 | ORAL_TABLET | ORAL | Status: DC | PRN

## 2023-07-20 MED ORDER — LACTATED RINGERS IV SOLN: INTRAVENOUS | Status: DC

## 2023-07-20 MED ORDER — ONDANSETRON 4 MG PO TBDP
8.0000 mg | ORAL_TABLET | Freq: Once | ORAL | Status: AC
  Administered 2023-07-20: 8 mg via ORAL
  Filled 2023-07-20: qty 2

## 2023-07-20 MED ORDER — MISOPROSTOL 50MCG HALF TABLET
50.0000 ug | ORAL_TABLET | Freq: Once | ORAL | Status: AC
Start: 2023-07-21 — End: 2023-07-20
  Administered 2023-07-20: 50 ug via ORAL
  Filled 2023-07-20: qty 1

## 2023-07-20 MED ORDER — LACTATED RINGERS IV SOLN
500.0000 mL | INTRAVENOUS | Status: DC | PRN
  Administered 2023-07-21: 500 mL via INTRAVENOUS

## 2023-07-20 MED ORDER — MISOPROSTOL 25 MCG QUARTER TABLET
25.0000 ug | ORAL_TABLET | Freq: Once | ORAL | Status: AC
  Administered 2023-07-20: 25 ug via VAGINAL
  Filled 2023-07-20: qty 1

## 2023-07-20 MED ORDER — ONDANSETRON HCL 4 MG/2ML IJ SOLN
4.0000 mg | Freq: Four times a day (QID) | INTRAMUSCULAR | Status: DC | PRN
  Administered 2023-07-21 – 2023-07-22 (×4): 4 mg via INTRAVENOUS
  Filled 2023-07-20 (×4): qty 2

## 2023-07-20 NOTE — MAU Provider Note (Signed)
 Chief Complaint:  Hypertension   HPI     Veronica Allen is a 28 y.o. G1P0000 at [redacted]w[redacted]d who presents to maternity admissions reporting that she was sent from [facility] office for elevated BP's and was to be a direct admit to LD for gHTN - IOL . She denies any VB, LOF, reports good FM's  [facility]: [facility]  Past Medical History:  Diagnosis Date   Complication of anesthesia    told that she fights anesthesia   Heart murmur    Migraine 05/01/2020   OB History  Gravida Para Term Preterm AB Living  1 0 0 0 0 0  SAB IAB Ectopic Multiple Live Births  0 0 0 0 0    # Outcome Date GA Lbr Len/2nd Weight Sex Type Anes PTL Lv  1 Current            Past Surgical History:  Procedure Laterality Date   WISDOM TOOTH EXTRACTION  06/2015   Family History  Problem Relation Age of Onset   Asthma Mother    Asthma Sister    Allergic rhinitis Sister    Eczema Sister    Cancer Maternal Grandfather        lung   Diabetes Paternal Grandmother    Diabetes Paternal Grandfather    Social History   Tobacco Use   Smoking status: Never   Smokeless tobacco: Never  Vaping Use   Vaping status: Never Used  Substance Use Topics   Alcohol use: Not Currently    Comment: social   Drug use: No   Allergies  Allergen Reactions   Augmentin  [Amoxicillin -Pot Clavulanate] Rash   Medications Prior to Admission  Medication Sig Dispense Refill Last Dose/Taking   ferrous sulfate  325 (65 FE) MG tablet Take 1 tablet (325 mg total) by mouth every other day. 45 tablet 2 07/19/2023   omeprazole  (PRILOSEC) 20 MG capsule Take 1 capsule (20 mg total) by mouth daily. 30 capsule 6 07/19/2023   ondansetron  (ZOFRAN ) 4 MG tablet Take 1 tablet (4 mg total) by mouth every 8 (eight) hours as needed. 20 tablet 6 07/19/2023   Prenatal MV & Min w/FA-DHA (PRENATAL GUMMIES PO) Take by mouth.   07/19/2023   valACYclovir  (VALTREX ) 1000 MG tablet Take 1 tablet (1,000 mg total) by mouth daily. 90 tablet 1 07/19/2023    valACYclovir  (VALTREX ) 1000 MG tablet Take 2 now then repeat 2 tablets in 12 hours for treatment of cold sores as needed 8 tablet 12 07/19/2023    I have reviewed patient's Past Medical Hx, Surgical Hx, Family Hx, Social Hx, medications and allergies.   ROS  Pertinent items noted in HPI and remainder of comprehensive ROS otherwise negative.   PHYSICAL EXAM  Patient Vitals for the past 24 hrs:  BP Temp Temp src Pulse Resp SpO2 Height Weight  07/20/23 1931 97/75 -- -- (!) 102 -- -- -- --  07/20/23 1909 119/76 -- -- 92 -- -- -- --  07/20/23 1733 126/73 (!) 97.3 F (36.3 C) Oral (!) 101 18 100 % 5\' 3"  (1.6 m) 81.1 kg    Constitutional: Well-developed, well-nourished female in no acute distress.  Cardiovascular: normal rate & rhythm, warm and well-perfused Respiratory: normal effort, no problems with respiration noted GI: Abd soft, non-tender, gravid MS: Extremities nontender, no edema, normal ROM Neurologic: Alert and oriented x 4.  GU: no CVA tenderness Pelvic:  from Dr Randolm Butte  2.5/50/-3 in Office      Fetal Tracing: Cat 1 reactive  Baseline: 130 Variability: moderate  Accelerations: Present Decelerations: absent Toco: irregular    Labs: Results for orders placed or performed during the hospital encounter of 07/20/23 (from the past 24 hours)  Protein / creatinine ratio, urine     Status: None   Collection Time: 07/20/23  5:37 PM  Result Value Ref Range   Creatinine, Urine 246 mg/dL   Total Protein, Urine 36 mg/dL   Protein Creatinine Ratio 0.15 0.00 - 0.15 mg/mg[Cre]  Comprehensive metabolic panel     Status: Abnormal   Collection Time: 07/20/23  7:23 PM  Result Value Ref Range   Sodium 137 135 - 145 mmol/L   Potassium 3.9 3.5 - 5.1 mmol/L   Chloride 107 98 - 111 mmol/L   CO2 20 (L) 22 - 32 mmol/L   Glucose, Bld 85 70 - 99 mg/dL   BUN 7 6 - 20 mg/dL   Creatinine, Ser 1.30 0.44 - 1.00 mg/dL   Calcium 8.9 8.9 - 86.5 mg/dL   Total Protein 6.0 (L) 6.5 - 8.1 g/dL   Albumin  2.7 (L) 3.5 - 5.0 g/dL   AST 18 15 - 41 U/L   ALT 12 0 - 44 U/L   Alkaline Phosphatase 109 38 - 126 U/L   Total Bilirubin 0.8 0.0 - 1.2 mg/dL   GFR, Estimated >78 >46 mL/min   Anion gap 10 5 - 15     MDM & MAU COURSE  MDM:  Admit to LD  Based on chart review BP's elevated on 06/12/23  143/86 and today 133/90 meets criteria for gHTN  PreE labs ordered Admit for IOL    MAU Course: Orders Placed This Encounter  Procedures   CBC   Protein / creatinine ratio, urine   Comprehensive metabolic panel   Measure blood pressure   Saline lock IV   Meds ordered this encounter  Medications   ondansetron  (ZOFRAN -ODT) disintegrating tablet 8 mg    ASSESSMENT   1. Gestational hypertension, third trimester     PLAN  Admit to LD LD Team aware and basic Orders for admission placed   Debbe Fail, MSN, [facility], [facility] for [employer]

## 2023-07-20 NOTE — MAU Note (Signed)
 Veronica Allen is a 28 y.o. at [redacted]w[redacted]d here in MAU reporting: sent in for elevated BP,  pt for direct admit/IOL.  During membrane sweep she had vagal response. Still feeling wonky now. HA, and nausea.  Cramping/contractions. Had blood and mucous since membrane sweep.  +FM.  Onset of complaint: ongoing Pain score: abd 3, HA 2 Vitals:   07/20/23 1733  BP: 126/73  Pulse: (!) 101  Resp: 18  Temp: (!) 97.3 F (36.3 C)  SpO2: 100%     FHT:134 Lab orders placed from triage:

## 2023-07-20 NOTE — Progress Notes (Signed)
   LOW-RISK PREGNANCY VISIT Patient name: Veronica Allen MRN [id number]  Date of birth: 1995/08/12 Chief Complaint:   Routine Prenatal Visit  History of Present Illness:   Veronica Allen is a 28 y.o. G1P0000 female at [redacted]w[redacted]d with an Estimated Date of Delivery: 07/26/23 being seen today for ongoing management of a low-risk pregnancy.     01/12/2023    9:32 AM 11/11/2022   11:15 AM 07/07/2022   10:13 AM 11/07/2019    8:57 AM 04/24/2016    2:22 PM  Depression screen PHQ 2/9  Decreased Interest 0 0 0 0 0  Down, Depressed, Hopeless 0 0 0 0 0  PHQ - 2 Score 0 0 0 0 0  Altered sleeping 0 0 0 0   Tired, decreased energy 0 0 0 0   Change in appetite 0 0 0 0   Feeling bad or failure about yourself  0 0 0 0   Trouble concentrating 0 0 0 0   Moving slowly or fidgety/restless 0 0 0 0   Suicidal thoughts 0 0 0 0   PHQ-9 Score 0 0 0 0     Today she reports pelvic pressure. Contractions: Irritability. Vag. Bleeding: None.  Movement: Present. denies leaking of fluid. Review of Systems:   Pertinent items are noted in HPI Denies abnormal vaginal discharge w/ itching/odor/irritation, headaches, visual changes, shortness of breath, chest pain, abdominal pain, severe nausea/vomiting, or problems with urination or bowel movements unless otherwise stated above. Pertinent History Reviewed:  Reviewed past medical,surgical, social, obstetrical and family history.  Reviewed problem list, medications and allergies. Physical Assessment:   Vitals:   07/20/23 1518 07/20/23 1530  BP: (!) 133/90 136/88  Pulse: (!) 109 (!) 144  Weight: 179 lb 8 oz (81.4 kg)   Body mass index is 31.8 kg/m.        Physical Examination:   General appearance: Well appearing, and in no distress  Mental status: Alert, oriented to person, place, and time  Skin: Warm & dry  Cardiovascular: Normal heart rate noted  Respiratory: Normal respiratory effort, no distress  Abdomen: Soft, gravid, nontender  Pelvic: Cervical exam  performed  Dilation: 2.5 Effacement (%): 50 Station: -3  Extremities: Edema: Trace  Fetal Status: Fetal Heart Rate (bpm): 132 Fundal Height: 41 cm Movement: Present Presentation: Vertex  Chaperone: Alphonso Aschoff    No results found for this or any previous visit (from the past 24 hours).  Assessment & Plan:  1) Low-risk pregnancy G1P0000 at [redacted]w[redacted]d with an Estimated Date of Delivery: 07/26/23   2) Suspeced fetal macrosomia-->IOL scheduled yesterday, has not gotten called in yet, membrane sweep today    Meds: No orders of the defined types were placed in this encounter.  Labs/procedures today:   Plan:  Continue routine obstetrical care  Next visit: prefers in person    BP a bit up but is due to go in anyway  Follow-up: No follow-ups on file.  No orders of the defined types were placed in this encounter.   Wendelyn Halter, MD 07/20/2023 3:55 PM

## 2023-07-20 NOTE — H&P (Signed)
 OBSTETRIC ADMISSION HISTORY AND PHYSICAL  Veronica Allen is a 28 y.o. female G1P0000 with IUP at [redacted]w[redacted]d by 6 week US  presenting for IOL for GHTN at 39 weeks. She reports +FMs, No LOF, no VB, no blurry vision, headaches or peripheral edema, and RUQ pain.  She plans on breast feeding. She request POPs vs IUD for birth control. She received her prenatal care at [facility]   Dating: By 6 week US  --->  Estimated Date of Delivery: 07/26/23  Sono:   @[redacted]w[redacted]d , CWD, normal female anatomy, cephalic presentation, posterior fundal placenta lie, 3882  grams, 99 %  EFW, 87% HC, 99% AC.   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, on Valtrex  - Anemia Hb 10.6 - Rh negative - Asymptomatic bacteriuria 1st TM; 01/19/23 (Ecoli) macrobid   POC 02/09/23 + enterococcus faecalis  rx macrobid  02/12/23   POC 03/03/23 +enterococcus faecalis, has been tx w/ macrobid  x 2, allergic to augmentin  so can't do PCN, per UTD ok not to tx subsequent ASB, pt to let us  know if becomes symptomatic   Past Medical History: Past Medical History:  Diagnosis Date   Complication of anesthesia    told that she fights anesthesia   Heart murmur    Migraine 05/01/2020    Past Surgical History: Past Surgical History:  Procedure Laterality Date   WISDOM TOOTH EXTRACTION  06/2015    Obstetrical History: OB History     Gravida  1   Para  0   Term  0   Preterm  0   AB  0   Living  0      SAB  0   IAB  0   Ectopic  0   Multiple  0   Live Births  0           Social History Social History   Socioeconomic History   Marital status: Married    Spouse name: Not on  file   Number of children: Not on file   Years of education: Not on file   Highest education level: Not on file  Occupational History   Not on file  Tobacco Use   Smoking status: Never   Smokeless tobacco: Never  Vaping Use   Vaping status: Never Used  Substance and Sexual Activity   Alcohol use: Not Currently    Comment: social   Drug use: No   Sexual activity: Yes    Birth control/protection: None  Other Topics Concern   Not on file  Social History Narrative   Not on file   Social Drivers of Health   Financial Resource Strain: Low Risk  (11/11/2022)   Overall Financial Resource Strain (CARDIA)    Difficulty of Paying Living Expenses: Not hard at all  Food Insecurity: No Food Insecurity (11/11/2022)   Hunger Vital Sign    Worried About Running Out of Food in the Last Year: Never true    Ran Out of Food in the Last Year: Never true  Transportation Needs: No Transportation Needs (11/11/2022)   PRAPARE - Administrator, Civil Service (Medical): No    Lack of Transportation (Non-Medical): No  Physical Activity: Sufficiently Active (11/11/2022)  Exercise Vital Sign    Days of Exercise per Week: 5 days    Minutes of Exercise per Session: 30 min  Stress: No Stress Concern Present (11/11/2022)   [employer] of Occupational Health - Occupational Stress Questionnaire    Feeling of Stress : Only a little  Social Connections: Moderately Isolated (11/11/2022)   Social Connection and Isolation Panel [NHANES]    Frequency of Communication with Friends and Family: More than three times a week    Frequency of Social Gatherings with Friends and Family: More than three times a week    Attends Religious Services: Never    Database administrator or Organizations: No    Attends Engineer, structural: Never    Marital Status: Married    Family History: Family History  Problem Relation Age of Onset   Asthma Mother    Asthma Sister    Allergic rhinitis Sister     Eczema Sister    Cancer Maternal Grandfather        lung   Diabetes Paternal Grandmother    Diabetes Paternal Grandfather     Allergies: Allergies  Allergen Reactions   Augmentin  [Amoxicillin -Pot Clavulanate] Rash    Medications Prior to Admission  Medication Sig Dispense Refill Last Dose/Taking   ferrous sulfate  325 (65 FE) MG tablet Take 1 tablet (325 mg total) by mouth every other day. 45 tablet 2 07/19/2023   omeprazole  (PRILOSEC) 20 MG capsule Take 1 capsule (20 mg total) by mouth daily. 30 capsule 6 07/19/2023   ondansetron  (ZOFRAN ) 4 MG tablet Take 1 tablet (4 mg total) by mouth every 8 (eight) hours as needed. 20 tablet 6 07/19/2023   Prenatal MV & Min w/FA-DHA (PRENATAL GUMMIES PO) Take by mouth.   07/19/2023   valACYclovir  (VALTREX ) 1000 MG tablet Take 1 tablet (1,000 mg total) by mouth daily. 90 tablet 1 07/19/2023   valACYclovir  (VALTREX ) 1000 MG tablet Take 2 now then repeat 2 tablets in 12 hours for treatment of cold sores as needed 8 tablet 12 07/19/2023     Review of Systems   All systems reviewed and negative except as stated in HPI  Blood pressure 97/75, pulse (!) 102, temperature (!) 97.3 F (36.3 C), temperature source Oral, resp. rate 18, height 5\' 3"  (1.6 m), weight 81.1 kg, last menstrual period 10/08/2022, SpO2 100%. General appearance: alert, cooperative, appears stated age, and mild distress Lungs: clear to auscultation bilaterally Heart: regular rate and rhythm Abdomen: soft, non-tender; bowel sounds normal Pelvic: adequate, unproven Extremities: Homans sign is negative, no sign of DVT DTR's 2+ Presentation: cephalic Fetal monitoringBaseline: 125 bpm, Variability: Good {> 6 bpm), Accelerations: Reactive, and Decelerations: Absent Uterine activity irregular   2.5/50/-3 earlier today in office  Prenatal labs: ABO, Rh: O/Negative/-- (10/21 1008) Antibody: Negative (02/06 0821) Rubella: 1.59 (10/21 1008) RPR: Non Reactive (02/06 0821)  HBsAg: Negative  (10/21 1008)  HIV: Non Reactive (02/06 0821)  GBS: Negative/-- (04/11 1500)    Lab Results  Component Value Date   GBS Negative 07/03/2023   GTT normal Genetic screening  negative, low risk female Anatomy US  normal female  Immunization History  Administered Date(s) Administered   DTaP 02/23/1995, 04/28/1995, 06/26/1995, 01/05/1996, 05/29/1999   H1N1 02/02/2008   HIB (PRP-OMP) 02/23/1995, 04/28/1995, 06/26/1995, 01/05/1996   HPV Quadrivalent 02/11/2006, 05/13/2006, 09/09/2006   Hepatitis B 09-Dec-1994, 02/23/1995, 06/26/1995   IPV 02/23/1995, 04/28/1995, 01/05/1996, 05/29/1999   Influenza-Unspecified 01/08/2005, 02/11/2006, 01/27/2007, 12/13/2007, 12/20/2008, 01/03/2010, 01/10/2011, 12/16/2011, 01/12/2014, 12/20/2014, 12/27/2019  MMR 01/05/1996, 05/29/1999   Meningococcal Conjugate 01/21/2006   PPD Test 07/29/2013, 08/09/2013, 08/01/2014   Rho (D) Immune Globulin 05/21/2023   Td 01/21/2006   Tdap 01/21/2006, 05/08/2015, 04/30/2023   Unspecified SARS-COV-2 Vaccination 04/04/2019, 07/11/2019   Varicella 04/06/1996    Prenatal Transfer Tool  Maternal Diabetes: No Genetic Screening: Normal Maternal Ultrasounds/Referrals: Normal Fetal Ultrasounds or other Referrals:  None Maternal Substance Abuse:  No Significant Maternal Medications:  Meds include: Other: Valtrex  Significant Maternal Lab Results: Group B Strep negative and Rh negative Number of Prenatal Visits:greater than 3 verified prenatal visits Maternal Vaccinations:TDap and Flu Other Comments:  None   Results for orders placed or performed during the hospital encounter of 07/20/23 (from the past 24 hours)  Protein / creatinine ratio, urine   Collection Time: 07/20/23  5:37 PM  Result Value Ref Range   Creatinine, Urine 246 mg/dL   Total Protein, Urine 36 mg/dL   Protein Creatinine Ratio 0.15 0.00 - 0.15 mg/mg[Cre]  Comprehensive metabolic panel   Collection Time: 07/20/23  7:23 PM  Result Value Ref Range   Sodium 137  135 - 145 mmol/L   Potassium 3.9 3.5 - 5.1 mmol/L   Chloride 107 98 - 111 mmol/L   CO2 20 (L) 22 - 32 mmol/L   Glucose, Bld 85 70 - 99 mg/dL   BUN 7 6 - 20 mg/dL   Creatinine, Ser 1.32 0.44 - 1.00 mg/dL   Calcium 8.9 8.9 - 44.0 mg/dL   Total Protein 6.0 (L) 6.5 - 8.1 g/dL   Albumin 2.7 (L) 3.5 - 5.0 g/dL   AST 18 15 - 41 U/L   ALT 12 0 - 44 U/L   Alkaline Phosphatase 109 38 - 126 U/L   Total Bilirubin 0.8 0.0 - 1.2 mg/dL   GFR, Estimated >10 >27 mL/min   Anion gap 10 5 - 15    Patient Active Problem List   Diagnosis Date Noted   Asymptomatic bacteriuria during pregnancy in first trimester 01/19/2023   Rh negative state in antepartum period 01/13/2023   Supervision of normal first pregnancy 01/12/2023   Fever blisters 01/12/2023   Mitral valve regurgitation 08/13/2022   Seasonal and perennial allergic rhinitis 01/10/2022   Seasonal allergic conjunctivitis 01/10/2022   Neuroma 05/07/2020   Migraine 05/01/2020    Assessment/Plan:  Veronica Allen is a 28 y.o. G1P0000 at 100w1d here for IOL for GHTN and suspected LGA  #Labor:Augmentation with AROM +/- Pitocin given favorable SVE earlier today #Pain: Per pt request #FWB: Cat I #GBS status:  negative #Feeding: Breastmilk  #Reproductive Life planning: Progesterone only pills and IUD unsure #Circ:  yes  Darrow End, MD  07/20/2023, 8:23 PM

## 2023-07-21 ENCOUNTER — Inpatient Hospital Stay (HOSPITAL_COMMUNITY): Admitting: Anesthesiology

## 2023-07-21 LAB — CBC
HCT: 39.4 % (ref 36.0–46.0)
HCT: 36.5 % (ref 36.0–46.0)
Hemoglobin: 13.1 g/dL (ref 12.0–15.0)
Hemoglobin: 11.9 g/dL — ABNORMAL LOW (ref 12.0–15.0)
MCH: 28.5 pg (ref 26.0–34.0)
MCH: 28.5 pg (ref 26.0–34.0)
MCHC: 33.2 g/dL (ref 30.0–36.0)
MCHC: 32.6 g/dL (ref 30.0–36.0)
MCV: 85.8 fL (ref 80.0–100.0)
MCV: 87.3 fL (ref 80.0–100.0)
Platelets: 330 10*3/uL (ref 150–400)
Platelets: 319 10*3/uL (ref 150–400)
RBC: 4.59 MIL/uL (ref 3.87–5.11)
RBC: 4.18 MIL/uL (ref 3.87–5.11)
RDW: 14.8 % (ref 11.5–15.5)
RDW: 14.8 % (ref 11.5–15.5)
WBC: 14.8 10*3/uL — ABNORMAL HIGH (ref 4.0–10.5)
WBC: 15.2 10*3/uL — ABNORMAL HIGH (ref 4.0–10.5)
nRBC: 0 % (ref 0.0–0.2)
nRBC: 0 % (ref 0.0–0.2)

## 2023-07-21 LAB — TYPE AND SCREEN
ABO/RH(D): O NEG
Antibody Screen: POSITIVE

## 2023-07-21 LAB — RPR: RPR Ser Ql: NONREACTIVE

## 2023-07-21 MED ORDER — SODIUM CHLORIDE 0.9 % IV SOLN
25.0000 mg | Freq: Once | INTRAVENOUS | Status: AC
  Administered 2023-07-21: 25 mg via INTRAVENOUS
  Filled 2023-07-21: qty 1

## 2023-07-21 MED ORDER — LIDOCAINE HCL (PF) 1 % IJ SOLN
INTRAMUSCULAR | Status: DC | PRN
  Administered 2023-07-21 (×2): 4 mL via EPIDURAL

## 2023-07-21 MED ORDER — EPHEDRINE 5 MG/ML INJ: 10.0000 mg | INTRAVENOUS | Status: DC | PRN

## 2023-07-21 MED ORDER — FENTANYL-BUPIVACAINE-NACL 0.5-0.125-0.9 MG/250ML-% EP SOLN
12.0000 mL/h | EPIDURAL | Status: DC | PRN
  Administered 2023-07-21: 12 mL/h via EPIDURAL

## 2023-07-21 MED ORDER — FENTANYL-BUPIVACAINE-NACL 0.5-0.125-0.9 MG/250ML-% EP SOLN
12.0000 mL/h | EPIDURAL | Status: DC | PRN
  Filled 2023-07-21: qty 250

## 2023-07-21 MED ORDER — PHENYLEPHRINE 80 MCG/ML (10ML) SYRINGE FOR IV PUSH (FOR BLOOD PRESSURE SUPPORT): 80.0000 ug | PREFILLED_SYRINGE | INTRAVENOUS | Status: DC | PRN

## 2023-07-21 MED ORDER — TERBUTALINE SULFATE 1 MG/ML IJ SOLN: 0.2500 mg | Freq: Once | INTRAMUSCULAR | Status: DC | PRN

## 2023-07-21 MED ORDER — DIPHENHYDRAMINE HCL 50 MG/ML IJ SOLN: 12.5000 mg | INTRAMUSCULAR | Status: DC | PRN

## 2023-07-21 MED ORDER — LACTATED RINGERS IV SOLN
500.0000 mL | Freq: Once | INTRAVENOUS | Status: AC
  Administered 2023-07-21: 500 mL via INTRAVENOUS

## 2023-07-21 MED ORDER — OXYTOCIN-SODIUM CHLORIDE 30-0.9 UT/500ML-% IV SOLN
1.0000 m[IU]/min | INTRAVENOUS | Status: DC
  Administered 2023-07-21: 2 m[IU]/min via INTRAVENOUS
  Administered 2023-07-22: 20 m[IU]/min via INTRAVENOUS
  Filled 2023-07-21 (×2): qty 500

## 2023-07-21 MED ORDER — LACTATED RINGERS IV SOLN: 500.0000 mL | Freq: Once | INTRAVENOUS | Status: DC

## 2023-07-21 NOTE — Progress Notes (Signed)
 Patient ID: Veronica Allen, female   DOB: 04/18/1995, 28 y.o.   MRN: [id number]  BP 111/70   Pulse 92   Temp 98.3 F (36.8 C) (Oral)   Resp 18   Ht 5\' 3"  (1.6 m)   Wt 178 lb 14.4 oz (81.1 kg)   LMP 10/08/2022   SpO2 99%   BMI 31.69 kg/m    Dilation: 3.5 Effacement (%): 90 Cervical Position: Middle Station: -2 Presentation: Vertex Exam by:: Lemuel Quaker, CNM  Pt feeling more intensity to contractions, requested AROM attempt with zofran /fentanyl premedication. AROM completed with clear return of fluid and head well applied to cervix. Pt tolerated procedure well. Category 1 tracing. Continue position changes and Pitocin titration. CBC ordered in case pt desires epidural.  Lemuel Quaker, CNM, MSN, IBCLC Certified Nurse Midwife, [facility]

## 2023-07-21 NOTE — Anesthesia Preprocedure Evaluation (Signed)
 Anesthesia Evaluation  Patient identified by MRN, date of birth, ID band Patient awake    Reviewed: Allergy & Precautions, Patient's Chart, lab work & pertinent test results  History of Anesthesia Complications Negative for: history of anesthetic complications  Airway Mallampati: II  TM Distance: >3 FB Neck ROM: Full    Dental no notable dental hx.    Pulmonary neg pulmonary ROS   Pulmonary exam normal        Cardiovascular negative cardio ROS Normal cardiovascular exam     Neuro/Psych  Headaches    GI/Hepatic negative GI ROS, Neg liver ROS,,,  Endo/Other  negative endocrine ROS    Renal/GU negative Renal ROS     Musculoskeletal negative musculoskeletal ROS (+)    Abdominal   Peds  Hematology negative hematology ROS (+)   Anesthesia Other Findings Day of surgery medications reviewed with patient.  Reproductive/Obstetrics (+) Pregnancy (suspected LGA, gHTN)                             Anesthesia Physical Anesthesia Plan  ASA: 2  Anesthesia Plan: Epidural   Post-op Pain Management:    Induction:   PONV Risk Score and Plan: Treatment may vary due to age or medical condition  Airway Management Planned: Natural Airway  Additional Equipment: Fetal Monitoring  Intra-op Plan:   Post-operative Plan:   Informed Consent: I have reviewed the patients History and Physical, chart, labs and discussed the procedure including the risks, benefits and alternatives for the proposed anesthesia with the patient or authorized representative who has indicated his/her understanding and acceptance.       Plan Discussed with:   Anesthesia Plan Comments:        Anesthesia Quick Evaluation

## 2023-07-21 NOTE — Progress Notes (Addendum)
 Patient ID: [name], female   DOB: March 31, 1995, 28 y.o.   MRN: [id number]  Labor Progress Note Veronica Allen is a 28 y.o. G1P0000 at [redacted]w[redacted]d presented for IOL for gHTN with suspected LGA (99%ile, est 4500g)  S:  Pt resting in bed, back is feeling pretty sore (across the lower part). Feeling a little anxious about having a big baby but excited to get things going. Not feeling contractions very strongly.   O:  BP 124/77   Pulse 94   Temp 98 F (36.7 C) (Oral)   Resp 16   Ht 5\' 3"  (1.6 m)   Wt 178 lb 14.4 oz (81.1 kg)   LMP 10/08/2022   SpO2 99%   BMI 31.69 kg/m  EFM: baseline 130 bpm/ moderate variability/ 15x15 accels/ no decels  Toco/IUPC: q1-23min, coupleting SVE: Dilation: 3 Effacement (%): 70 Cervical Position: Middle Station: -2 Presentation: Vertex Exam by:: America Just, rnc Pitocin: to start and hold at 6 mu/min  A/P: 28 y.o. G1P0000 [redacted]w[redacted]d  1. Labor: Latent, planned to AROM but per the RN, baby is not at all engaged in the pelvis and feels OP (pt also having low back pain). Will start Pitocin titration and RN to use position changes to facilitate fetal engagement and OA presentation. Encouraged pt to walk and use the ball as well. 2. FWB: Cat 1 3. Pain: Open to epidural, concerned about nausea from fentanyl, explained that it does not usually cause nausea to the extent that oral or IV narcotics do. Can premedicate if desired. 4. gHTN: BP borderline but not severe, no s/sx PEC.   Derick Fleeting, CNM 10:26 AM

## 2023-07-21 NOTE — Progress Notes (Signed)
 LABOR PROGRESS NOTE  Patient Name: Veronica Allen, female   DOB: 12-Aug-1995, 28 y.o.  MRN: [id number]  Pt rating contractions 5/10. S/p Dual cytotec. Continues to contract every 1-2 minutes. Cat I.   Blood pressure 131/83, pulse (!) 104, temperature 98.3 F (36.8 C), temperature source Oral, resp. rate 17, height 5\' 3"  (1.6 m), weight 81.1 kg, last menstrual period 10/08/2022, SpO2 99%.  Dilation: 2.5 Effacement (%): 60 Station: -3 Presentation: Vertex Exam by:: America Just, rnc  Will given another hour or two for contractions to space prior to next intervention.  Suspect AROM at next check, given initial exam above.  Anticipate vaginal delivery  Keyunna Coco, MD

## 2023-07-21 NOTE — Progress Notes (Signed)
 Patient ID: Veronica Allen, female   DOB: [birth year], 28 y.o.   MRN: [id number]  BP 114/71 (BP Location: Left Arm)   Pulse 94   Temp 98.2 F (36.8 C) (Axillary)   Resp 18   Ht 5\' 3"  (1.6 m)   Wt 178 lb 14.4 oz (81.1 kg)   LMP 10/08/2022   SpO2 99%   BMI 31.69 kg/m   Dilation: 2 Effacement (%): 80 Cervical Position: Middle Station: -2 Presentation: Vertex Exam by:: Lemuel Quaker, CNM  Baby has moved down considerably, cervix is softer. Pt unable to tolerate attempted AROM due to back pain and q70min contractions. Wants to wait until she is further dilated, wants to try again in 2hrs. Cat 1 tracing.  Lemuel Quaker, CNM, MSN, IBCLC Certified Nurse Midwife, [facility]

## 2023-07-21 NOTE — Anesthesia Procedure Notes (Signed)
 Epidural Patient location during procedure: OB Start time: 07/21/2023 7:00 PM End time: 07/21/2023 7:03 PM  Staffing Anesthesiologist: Vernadine Golas, MD Performed: anesthesiologist   Preanesthetic Checklist Completed: patient identified, IV checked, risks and benefits discussed, monitors and equipment checked, pre-op evaluation and timeout performed  Epidural Patient position: sitting Prep: DuraPrep and site prepped and draped Patient monitoring: continuous pulse ox, blood pressure and heart rate Approach: midline Location: L3-L4 Injection technique: LOR air  Needle:  Needle type: Tuohy  Needle gauge: 17 G Needle length: 9 cm Needle insertion depth: 5 cm Catheter type: closed end flexible Catheter size: 19 Gauge Catheter at skin depth: 10 cm Test dose: negative and Other (1% lidocaine)  Assessment Events: blood not aspirated, no cerebrospinal fluid, injection not painful, no injection resistance, no paresthesia and negative IV test  Additional Notes Patient identified. Risks, benefits, and alternatives discussed with patient including but not limited to bleeding, infection, nerve damage, paralysis, failed block, incomplete pain control, headache, blood pressure changes, nausea, vomiting, reactions to medication, itching, and postpartum back pain. Confirmed with bedside nurse the patient's most recent platelet count. Confirmed with patient that they are not currently taking any anticoagulation, have any bleeding history, or any family history of bleeding disorders. Patient expressed understanding and wished to proceed. All questions were answered. Sterile technique was used throughout the entire procedure. Please see nursing notes for vital signs.   Crisp LOR on first pass. Test dose was given through epidural catheter and negative prior to continuing to dose epidural or start infusion. Warning signs of high block given to the patient including shortness of breath,  tingling/numbness in hands, complete motor block, or any concerning symptoms with instructions to call for help. Patient was given instructions on fall risk and not to get out of bed. All questions and concerns addressed with instructions to call with any issues or inadequate analgesia.  Reason for block:procedure for pain

## 2023-07-22 ENCOUNTER — Encounter (HOSPITAL_COMMUNITY): Payer: Self-pay | Admitting: Obstetrics & Gynecology

## 2023-07-22 DIAGNOSIS — O139 Gestational [pregnancy-induced] hypertension without significant proteinuria, unspecified trimester: Secondary | ICD-10-CM | POA: Diagnosis present

## 2023-07-22 DIAGNOSIS — O134 Gestational [pregnancy-induced] hypertension without significant proteinuria, complicating childbirth: Secondary | ICD-10-CM | POA: Diagnosis not present

## 2023-07-22 DIAGNOSIS — Z3A39 39 weeks gestation of pregnancy: Secondary | ICD-10-CM | POA: Diagnosis not present

## 2023-07-22 DIAGNOSIS — O3663X0 Maternal care for excessive fetal growth, third trimester, not applicable or unspecified: Secondary | ICD-10-CM | POA: Diagnosis not present

## 2023-07-22 LAB — CBC
HCT: 36.3 % (ref 36.0–46.0)
Hemoglobin: 11.6 g/dL — ABNORMAL LOW (ref 12.0–15.0)
MCH: 28.3 pg (ref 26.0–34.0)
MCHC: 32 g/dL (ref 30.0–36.0)
MCV: 88.5 fL (ref 80.0–100.0)
Platelets: 301 10*3/uL (ref 150–400)
RBC: 4.1 MIL/uL (ref 3.87–5.11)
RDW: 15.3 % (ref 11.5–15.5)
WBC: 23.6 10*3/uL — ABNORMAL HIGH (ref 4.0–10.5)
nRBC: 0 % (ref 0.0–0.2)

## 2023-07-22 MED ORDER — ONDANSETRON HCL 4 MG/2ML IJ SOLN: 4.0000 mg | INTRAMUSCULAR | Status: DC | PRN

## 2023-07-22 MED ORDER — DIPHENHYDRAMINE HCL 25 MG PO CAPS: 25.0000 mg | ORAL_CAPSULE | Freq: Four times a day (QID) | ORAL | Status: DC | PRN

## 2023-07-22 MED ORDER — BENZOCAINE-MENTHOL 20-0.5 % EX AERO
1.0000 | INHALATION_SPRAY | CUTANEOUS | Status: DC | PRN
  Administered 2023-07-22: 1 via TOPICAL
  Filled 2023-07-22: qty 56

## 2023-07-22 MED ORDER — ZOLPIDEM TARTRATE 5 MG PO TABS: 5.0000 mg | ORAL_TABLET | Freq: Every evening | ORAL | Status: DC | PRN

## 2023-07-22 MED ORDER — FERROUS SULFATE 325 (65 FE) MG PO TABS
325.0000 mg | ORAL_TABLET | ORAL | Status: DC
  Administered 2023-07-22: 325 mg via ORAL
  Filled 2023-07-22: qty 1

## 2023-07-22 MED ORDER — TRANEXAMIC ACID-NACL 1000-0.7 MG/100ML-% IV SOLN
1000.0000 mg | INTRAVENOUS | Status: AC
  Administered 2023-07-22: 1000 mg via INTRAVENOUS
  Filled 2023-07-22: qty 100

## 2023-07-22 MED ORDER — ONDANSETRON HCL 4 MG PO TABS: 4.0000 mg | ORAL_TABLET | ORAL | Status: DC | PRN

## 2023-07-22 MED ORDER — POTASSIUM CHLORIDE CRYS ER 20 MEQ PO TBCR: 20.0000 meq | EXTENDED_RELEASE_TABLET | Freq: Every day | ORAL | Status: DC

## 2023-07-22 MED ORDER — PRENATAL MULTIVITAMIN CH: 1.0000 | ORAL_TABLET | Freq: Every day | ORAL | Status: DC

## 2023-07-22 MED ORDER — SODIUM CHLORIDE 0.9 % IV SOLN: 250.0000 mL | INTRAVENOUS | Status: DC | PRN

## 2023-07-22 MED ORDER — DIBUCAINE (PERIANAL) 1 % EX OINT: 1.0000 | TOPICAL_OINTMENT | CUTANEOUS | Status: DC | PRN

## 2023-07-22 MED ORDER — SIMETHICONE 80 MG PO CHEW: 80.0000 mg | CHEWABLE_TABLET | ORAL | Status: DC | PRN

## 2023-07-22 MED ORDER — MEASLES, MUMPS & RUBELLA VAC IJ SOLR: 0.5000 mL | Freq: Once | INTRAMUSCULAR | Status: DC

## 2023-07-22 MED ORDER — COCONUT OIL OIL: 1.0000 | TOPICAL_OIL | Status: DC | PRN

## 2023-07-22 MED ORDER — SODIUM CHLORIDE 0.9% FLUSH
3.0000 mL | Freq: Two times a day (BID) | INTRAVENOUS | Status: DC
  Administered 2023-07-23: 3 mL via INTRAVENOUS

## 2023-07-22 MED ORDER — TETANUS-DIPHTH-ACELL PERTUSSIS 5-2.5-18.5 LF-MCG/0.5 IM SUSY: 0.5000 mL | PREFILLED_SYRINGE | Freq: Once | INTRAMUSCULAR | Status: DC

## 2023-07-22 MED ORDER — IBUPROFEN 600 MG PO TABS
600.0000 mg | ORAL_TABLET | Freq: Four times a day (QID) | ORAL | Status: DC
  Administered 2023-07-22: 600 mg via ORAL
  Filled 2023-07-22 (×3): qty 1

## 2023-07-22 MED ORDER — ACETAMINOPHEN 325 MG PO TABS: 650.0000 mg | ORAL_TABLET | ORAL | Status: DC | PRN

## 2023-07-22 MED ORDER — SENNOSIDES-DOCUSATE SODIUM 8.6-50 MG PO TABS
2.0000 | ORAL_TABLET | ORAL | Status: DC
  Filled 2023-07-22: qty 2

## 2023-07-22 MED ORDER — SODIUM CHLORIDE 0.9% FLUSH: 3.0000 mL | INTRAVENOUS | Status: DC | PRN

## 2023-07-22 MED ORDER — FUROSEMIDE 20 MG PO TABS: 20.0000 mg | ORAL_TABLET | Freq: Every day | ORAL | Status: DC

## 2023-07-22 MED ORDER — WITCH HAZEL-GLYCERIN EX PADS: 1.0000 | MEDICATED_PAD | CUTANEOUS | Status: DC | PRN

## 2023-07-22 MED ORDER — VALACYCLOVIR HCL 500 MG PO TABS
1000.0000 mg | ORAL_TABLET | Freq: Every day | ORAL | Status: DC
  Administered 2023-07-22: 1000 mg via ORAL
  Filled 2023-07-22: qty 2

## 2023-07-22 MED ORDER — OXYCODONE HCL 5 MG PO TABS: 5.0000 mg | ORAL_TABLET | ORAL | Status: DC | PRN

## 2023-07-22 NOTE — Lactation Note (Signed)
 This note was copied from a baby's chart. Lactation Consultation Note  Patient Name: Veronica Allen [id number] Date: 07/22/2023 Age:29 hours Reason for consult: Initial assessment;Primapara;1st time breastfeeding;Term;RN request (hx oral HSV-valtrex )  P1- Per RN, infant has not been willing to nurse since birth 10 hrs ago. With this last feeding, MOB used the manual pump to get her colostrum stimulated, then hand expressed to offer a few drops of EBM to infant. When [facility] was asking MOB questions, MOB informed LC that she is not concerned over infant not eating well yet because he is a little confused. Infant has some birth trauma to his face and chest, is filled with fluid still (had an emesis episode) and had a glazed look on his face during the entire consult. MOB informed LC that she had just fed infant, so she will call for his next feeding so LC could assist. LC requested to perform an oral assessment on infant. MOB receptive at this time. LC used a gloved finger to try to elicit a suck. Infant opened his mouth for LC, but was unable to create a suction at this time. LC believes that infant needs a little more time to transition. MOB reported that she is willing to supplement a little if needed.  LC reviewed the first 24 hr birthday nap, day 2 cluster feeding, feeding infant on cue 8-12x in 24 hrs, not allowing infant to go over 3 hrs without a feeding, CDC milk storage guidelines, LC services handout and engorgement/breast care. LC encouraged MOB to call for further assistance as needed.  Maternal Data Has patient been taught Hand Expression?: Yes Does the patient have breastfeeding experience prior to this delivery?: No  Feeding Mother's Current Feeding Choice: Breast Milk  Lactation Tools Discussed/Used Tools: Pump;Flanges Breast pump type: Manual Pump Education: Setup, frequency, and cleaning;Milk Storage Reason for Pumping: MOB request Pumping frequency: 15-20 min every 3 hrs Pumped  volume:  (drops)  Interventions Interventions: Breast feeding basics reviewed;Hand pump;Education;LC Services brochure  Discharge Discharge Education: Engorgement and breast care;Warning signs for feeding baby Pump: DEBP;Hands Free;Personal;Manual  Consult Status Consult Status: Follow-up Date: 07/23/23 Follow-up type: In-patient    [name] BS, IBCLC 07/22/2023, 6:24 PM

## 2023-07-22 NOTE — Discharge Summary (Addendum)
 Postpartum Discharge Summary  Date of Service updated     Patient Name: Veronica Allen DOB: 1995-12-07 MRN: [id number]  Date of admission: 07/20/2023 Delivery date:07/22/2023 Delivering provider: LEVEQUE, ALYSSA Date of discharge: 07/23/2023  Admitting diagnosis: LGA (large for gestational age) fetus affecting management of mother [O36.60X0] Intrauterine pregnancy: [redacted]w[redacted]d     Secondary diagnosis:  Principal Problem:   LGA (large for gestational age) fetus affecting management of mother Active Problems:   NSVD (normal spontaneous vaginal delivery)   Obstetrical laceration, second degree   Gestational hypertension  Additional problems: Gestational hypertension -- patient declined Lasix /K, discussed importance of checking BP at home, to come in if develops si/sx pre-e    Discharge diagnosis: Term Pregnancy Delivered                                              Post partum procedures:rhogam Augmentation: AROM, Pitocin , and Cytotec  Complications: None  Hospital course: Induction of Labor With Vaginal Delivery   28 y.o. yo G1P1001 at [redacted]w[redacted]d was admitted to the hospital 07/20/2023 for induction of labor.  Indication for induction: Gestational hypertension.  Patient had an labor course uncomplicated. Membrane Rupture Time/Date: 5:55 PM,07/21/2023  Delivery Method:Vaginal, Spontaneous Operative Delivery:N/A Episiotomy: None Lacerations:  2nd degree Details of delivery can be found in separate delivery note.  Patient had a postpartum course complicated by nothing. Patient is discharged home 07/23/23.  Newborn Data: Birth date:07/22/2023 Birth time:7:31 AM Gender:Female Living status:Living Apgars:6 ,9  Weight:4180 g  Magnesium Sulfate received: No BMZ received: No Rhophylac :Yes MMR:Yes T-DaP:Given prenatally Flu: Yes RSV Vaccine received: No Transfusion:No  Immunizations received: Immunization History  Administered Date(s) Administered   DTaP 02/23/1995, 04/28/1995,  06/26/1995, 01/05/1996, 05/29/1999   H1N1 02/02/2008   HIB (PRP-OMP) 02/23/1995, 04/28/1995, 06/26/1995, 01/05/1996   HPV Quadrivalent 02/11/2006, 05/13/2006, 09/09/2006   Hepatitis B 30-May-1994, 02/23/1995, 06/26/1995   IPV 02/23/1995, 04/28/1995, 01/05/1996, 05/29/1999   Influenza-Unspecified 01/08/2005, 02/11/2006, 01/27/2007, 12/13/2007, 12/20/2008, 01/03/2010, 01/10/2011, 12/16/2011, 01/12/2014, 12/20/2014, 12/27/2019   MMR 01/05/1996, 05/29/1999   Meningococcal Conjugate 01/21/2006   PPD Test 07/29/2013, 08/09/2013, 08/01/2014   Rho (D) Immune Globulin  05/21/2023   Td 01/21/2006   Tdap 01/21/2006, 05/08/2015, 04/30/2023   Unspecified SARS-COV-2 Vaccination 04/04/2019, 07/11/2019   Varicella 04/06/1996    Physical exam  Vitals:   07/22/23 1513 07/22/23 1930 07/22/23 2330 07/23/23 0609  BP: 121/76 121/81 118/69 101/66  Pulse: (!) 108 100 93 92  Resp: 18 18  18   Temp: 98.5 F (36.9 C) 98.1 F (36.7 C)  98 F (36.7 C)  TempSrc: Oral Oral  Oral  SpO2:   98% 98%  Weight:      Height:       General: alert, cooperative, and no distress Lochia: appropriate Uterine Fundus: firm Incision: N/A DVT Evaluation: No cords or calf tenderness. No significant calf/ankle edema. Labs: Lab Results  Component Value Date   WBC 21.3 (H) 07/23/2023   HGB 11.1 (L) 07/23/2023   HCT 33.9 (L) 07/23/2023   MCV 87.1 07/23/2023   PLT 288 07/23/2023      Latest Ref Rng & Units 07/20/2023    7:23 PM  CMP  Glucose 70 - 99 mg/dL 85   BUN 6 - 20 mg/dL 7   Creatinine 4.69 - 6.29 mg/dL 5.28   Sodium 413 - 244 mmol/L 137   Potassium 3.5 - 5.1 mmol/L 3.9  Chloride 98 - 111 mmol/L 107   CO2 22 - 32 mmol/L 20   Calcium 8.9 - 10.3 mg/dL 8.9   Total Protein 6.5 - 8.1 g/dL 6.0   Total Bilirubin 0.0 - 1.2 mg/dL 0.8   Alkaline Phos 38 - 126 U/L 109   AST 15 - 41 U/L 18   ALT 0 - 44 U/L 12    Edinburgh Score:     No data to display         No data recorded  After visit meds:  Allergies  as of 07/23/2023       Reactions   Augmentin  [amoxicillin -pot Clavulanate] Rash        Medication List     STOP taking these medications    valACYclovir  1000 MG tablet Commonly known as: VALTREX        TAKE these medications    FeroSul 325 (65 Fe) MG tablet Generic drug: ferrous sulfate  Take 1 tablet (325 mg total) by mouth every other day.   ibuprofen  600 MG tablet Commonly known as: ADVIL  Take 1 tablet (600 mg total) by mouth every 6 (six) hours.   omeprazole  20 MG capsule Commonly known as: PRILOSEC Take 1 capsule (20 mg total) by mouth daily.   ondansetron  4 MG tablet Commonly known as: Zofran  Take 1 tablet (4 mg total) by mouth every 8 (eight) hours as needed.   PRENATAL GUMMIES PO Take by mouth.   senna-docusate 8.6-50 MG tablet Commonly known as: Senokot-S Take 2 tablets by mouth daily. Start taking on: Jul 24, 2023         Discharge home in stable condition Infant Feeding: Breast Infant Disposition:home with mother Discharge instruction: per After Visit Summary and Postpartum booklet. Activity: Advance as tolerated. Pelvic rest for 6 weeks.  Diet: routine diet Future Appointments: Future Appointments  Date Time Provider Department Center  07/30/2023  9:30 AM Majel Scott, CNM [facility]  08/26/2023 10:30 AM Jolayne Natter, CNM [facility]  11/12/2023  2:30 PM Bennet Brasil, MD [facility]   Follow up Visit:  Follow-up Information     [facility]. Schedule an appointment as soon as possible for a visit.   Specialty: Obstetrics and Gynecology Why: Routine post partum visit in 4-6 weeks Contact information: [address] [place]  [id number] [phone]               Message sent to [facility] 4/30  Please schedule this patient for a In person postpartum visit in 4 weeks with the following provider: Any provider. Additional Postpartum F/U:Postpartum  Depression checkup and BP check 1 week  High risk pregnancy complicated by: HTN Delivery mode:  Vaginal, Spontaneous Anticipated Birth Control:  POPs   07/23/2023 Melanie Spires, MD

## 2023-07-22 NOTE — Anesthesia Postprocedure Evaluation (Signed)
 Anesthesia Post Note  Patient: Cherise Skinner  Procedure(s) Performed: AN AD HOC LABOR EPIDURAL     Patient location during evaluation: Mother Baby Anesthesia Type: Epidural Level of consciousness: awake and alert Pain management: pain level controlled Vital Signs Assessment: post-procedure vital signs reviewed and stable Respiratory status: spontaneous breathing, nonlabored ventilation and respiratory function stable Cardiovascular status: stable Postop Assessment: no headache, no backache and epidural receding Anesthetic complications: no   No notable events documented.  Last Vitals:  Vitals:   07/22/23 1050 07/22/23 1513  BP: 110/63 121/76  Pulse: (!) 101 (!) 108  Resp: 18 18  Temp: 36.8 C 36.9 C  SpO2:      Last Pain:  Vitals:   07/22/23 1514  TempSrc:   PainSc: 0-No pain   Pain Goal:                   Cannen Dupras

## 2023-07-22 NOTE — Progress Notes (Signed)
 Labor Progress Note Veronica Allen is a 28 y.o. G1P0000 at [redacted]w[redacted]d presented for IOL for GHTN and LGA S: Pushing with great maternal effort. Slow progress  O:  BP (!) 123/95   Pulse 99   Temp 98.4 F (36.9 C) (Oral)   Resp 17   Ht 5\' 3"  (1.6 m)   Wt 81.1 kg   LMP 10/08/2022   SpO2 99%   BMI 31.69 kg/m   EFM: baseline 135, accels, no decels, moderate variability TOCO: q3-76min contractions  CVE: Dilation: 10 Dilation Complete Date: 07/22/23 Dilation Complete Time: 0343 Effacement (%): 100 Cervical Position: Middle Station: Plus 2 Presentation: Vertex Exam by:: Diana Ansah-Mensaj, rnc   A&P: 28 y.o. G1P0000 [redacted]w[redacted]d admitted for IOL #Labor: Progressing well. Complete, +2. Minimal caput. Continue to titrate pitocin for ctx every 2-3 minutes.  #Pain: Epidural, not feeling urge nor pressure #FWB: Cat I #GBS negative  #GHTN - asymptomatic, normo to hypotensive, labs wnl  Darrow End, MD 4:25 AM

## 2023-07-22 NOTE — Progress Notes (Signed)
   07/22/23 0343  Vital Signs  Temp 98.4 F (36.9 C)  Temp Source Oral  Fetal Heart Rate A  Mode External  Baseline Rate (A) 150 bpm  Variability 6-25 BPM  Accelerations 15 x 15  Decelerations Variable  OB Interventions  Interventions Position changed  Position Low-fowlers  Cervical Exam  Dilation 10  Dilation Complete Date 07/22/23  Dilation Complete Time 0343  Effacement (%) 100  Station Plus 2  Presentation Vertex  Exam by: Hersey Lorenzo, rnc  Provider Notification  Provider Name/Title Dr. Cooper Denver  Date Provider Notified 07/22/23  Time Provider Notified [phone]  Method of Notification At bedside   Pt's bladder seems distended, foley catheter in place. Foley balloon deflected and advance with no urine in tubing. Foley removed and In and out foley done with Dr. Cooper Denver, no urine. Dr. Cooper Denver assessed and palpated lower abdomen. Will continue to assess

## 2023-07-22 NOTE — Progress Notes (Signed)
 Labor Progress Note Veronica Allen is a 27 y.o. G1P0000 at [redacted]w[redacted]d presented for IOL for GHTN and LGA S: Comfortable with epidural.  O:  BP 104/61   Pulse 93   Temp 98.6 F (37 C) (Oral)   Resp 17   Ht 5\' 3"  (1.6 m)   Wt 81.1 kg   LMP 10/08/2022   SpO2 99%   BMI 31.69 kg/m   EFM: baseline 150, accels, no decels, moderate variability TOCO: q3-68min contractions  CVE: Dilation: Lip/rim Effacement (%): 100 Cervical Position: Middle Station: 0, Plus 1 Presentation: Vertex Exam by:: America Just, rnc   A&P: 28 y.o. G1P0000 [redacted]w[redacted]d admitted for IOL #Labor: Progressing well. Plan to recheck and start pushing around 0330 per nursing team. Continue to titrate pitocin as needed. Suspected LGA, shoulder precautions.  #Pain: Epidural #FWB: Cat I #GBS negative  #GHTN - asymptomatic, normo to hypotensive, labs wnl  Darrow End, MD 3:18 AM

## 2023-07-22 NOTE — Lactation Note (Signed)
 This note was copied from a baby's chart. Lactation Consultation Note  Patient Name: Veronica Allen [id number] Date: 07/22/2023 Age:29 hours Reason for consult: Follow-up assessment;Mother's request;Primapara;1st time breastfeeding;Term;Breastfeeding assistance;RN request  P1- Infant had a low blood sugar of 30, so the NAN and RN gelled him. Infant was still uninterested in latching due to the low blood sugar, so LC assisted MOB with pace feeding infant some formula. Infant took a total of 17 mL. LC encouraged MOB to call for further assistance as needed.  Maternal Data Has patient been taught Hand Expression?: Yes Does the patient have breastfeeding experience prior to this delivery?: No  Feeding Mother's Current Feeding Choice: Breast Milk Nipple Type: Slow - flow  LATCH Score Latch: Too sleepy or reluctant, no latch achieved, no sucking elicited.  Audible Swallowing: None  Type of Nipple: Everted at rest and after stimulation  Comfort (Breast/Nipple): Soft / non-tender  Hold (Positioning): Full assist, staff holds infant at breast  LATCH Score: 4   Lactation Tools Discussed/Used Tools: Pump;Flanges Flange Size: 18 Breast pump type: Manual Pump Education: Setup, frequency, and cleaning;Milk Storage  Interventions Interventions: Breast feeding basics reviewed;Education;Pace feeding;LC Services brochure  Discharge Discharge Education: Engorgement and breast care;Warning signs for feeding baby Pump: DEBP;Personal  Consult Status Consult Status: Follow-up Date: 07/23/23 Follow-up type: In-patient    [name] BS, IBCLC 07/22/2023, 11:38 PM

## 2023-07-23 ENCOUNTER — Other Ambulatory Visit (HOSPITAL_COMMUNITY): Payer: Self-pay

## 2023-07-23 LAB — CBC
HCT: 33.9 % — ABNORMAL LOW (ref 36.0–46.0)
Hemoglobin: 11.1 g/dL — ABNORMAL LOW (ref 12.0–15.0)
MCH: 28.5 pg (ref 26.0–34.0)
MCHC: 32.7 g/dL (ref 30.0–36.0)
MCV: 87.1 fL (ref 80.0–100.0)
Platelets: 288 10*3/uL (ref 150–400)
RBC: 3.89 MIL/uL (ref 3.87–5.11)
RDW: 15.2 % (ref 11.5–15.5)
WBC: 21.3 10*3/uL — ABNORMAL HIGH (ref 4.0–10.5)
nRBC: 0 % (ref 0.0–0.2)

## 2023-07-23 MED ORDER — IBUPROFEN 600 MG PO TABS
600.0000 mg | ORAL_TABLET | Freq: Four times a day (QID) | ORAL | 0 refills | Status: DC
  Filled 2023-07-23: qty 40, 10d supply, fill #0

## 2023-07-23 MED ORDER — SENNOSIDES-DOCUSATE SODIUM 8.6-50 MG PO TABS
2.0000 | ORAL_TABLET | ORAL | 0 refills | Status: DC
  Filled 2023-07-23: qty 60, 30d supply, fill #0

## 2023-07-23 MED ORDER — RHO D IMMUNE GLOBULIN 1500 UNIT/2ML IJ SOSY
300.0000 ug | PREFILLED_SYRINGE | Freq: Once | INTRAMUSCULAR | Status: AC
  Administered 2023-07-23: 300 ug via INTRAVENOUS
  Filled 2023-07-23: qty 2

## 2023-07-24 LAB — RH IG WORKUP (INCLUDES ABO/RH)
Fetal Screen: NEGATIVE
Gestational Age(Wks): 39.3
Unit division: 0

## 2023-07-25 ENCOUNTER — Encounter: Payer: Self-pay | Admitting: Obstetrics and Gynecology

## 2023-07-26 ENCOUNTER — Encounter: Payer: Self-pay | Admitting: Obstetrics and Gynecology

## 2023-07-28 ENCOUNTER — Encounter: Payer: Self-pay | Admitting: Obstetrics & Gynecology

## 2023-07-30 ENCOUNTER — Telehealth: Admitting: Advanced Practice Midwife

## 2023-08-02 ENCOUNTER — Inpatient Hospital Stay (HOSPITAL_COMMUNITY): Admission: AD | Admit: 2023-08-02 | Source: Home / Self Care

## 2023-08-02 ENCOUNTER — Inpatient Hospital Stay (HOSPITAL_COMMUNITY)

## 2023-08-03 ENCOUNTER — Encounter: Payer: Self-pay | Admitting: *Deleted

## 2023-08-03 ENCOUNTER — Encounter: Payer: Self-pay | Admitting: Women's Health

## 2023-08-03 ENCOUNTER — Telehealth: Admitting: Women's Health

## 2023-08-03 VITALS — BP 118/84 | HR 80

## 2023-08-03 DIAGNOSIS — Z1332 Encounter for screening for maternal depression: Secondary | ICD-10-CM | POA: Diagnosis not present

## 2023-08-03 DIAGNOSIS — Z3009 Encounter for other general counseling and advice on contraception: Secondary | ICD-10-CM

## 2023-08-03 DIAGNOSIS — Z8659 Personal history of other mental and behavioral disorders: Secondary | ICD-10-CM

## 2023-08-03 DIAGNOSIS — Z0130 Encounter for examination of blood pressure without abnormal findings: Secondary | ICD-10-CM

## 2023-08-03 NOTE — Progress Notes (Signed)
 TELEHEALTH VIRTUAL GYN VISIT ENCOUNTER NOTE Patient name: Veronica Allen MRN [id number]  Date of birth: 04-14-1995  I connected with patient on 08/03/23 at  8:50 AM EDT by MyChart video and verified that I am speaking with the correct person using two identifiers.  Pt is not currently in the office, she is at home.  Provider is in the office.    I discussed the limitations, risks, security and privacy concerns of performing an evaluation and management service by telephone and the availability of in person appointments. I also discussed with the patient that there may be a patient responsible charge related to this service. The patient expressed understanding and agreed to proceed.   Chief Complaint:   Follow-up  History of Present Illness:   Veronica Allen is a 28 y.o. G42P1001 Caucasian female 12d s/p SVB after IOL for GHTN being evaluated today for bp and mood check. Declined lasix  pp (bp was good and didn't have any swelling), not on any bp meds. H/O anxiety, never on meds. No h/o depression. Denies PPD/anxiety. Plans POPs for contraception. Breast (pumping) and bottle feeding.        01/12/2023    9:32 AM 11/11/2022   11:15 AM 07/07/2022   10:13 AM 11/07/2019    8:57 AM 04/24/2016    2:22 PM  Depression screen PHQ 2/9  Decreased Interest 0 0 0 0 0  Down, Depressed, Hopeless 0 0 0 0 0  PHQ - 2 Score 0 0 0 0 0  Altered sleeping 0 0 0 0   Tired, decreased energy 0 0 0 0   Change in appetite 0 0 0 0   Feeling bad or failure about yourself  0 0 0 0   Trouble concentrating 0 0 0 0   Moving slowly or fidgety/restless 0 0 0 0   Suicidal thoughts 0 0 0 0   PHQ-9 Score 0 0 0 0     Patient's last menstrual period was 10/08/2022. The current method of family planning is abstinence.  Review of Systems:   Pertinent items are noted in HPI Denies fever/chills, dizziness, headaches, visual disturbances, fatigue, shortness of breath, chest pain, abdominal pain, vomiting, abnormal  vaginal discharge/itching/odor/irritation, problems with periods, bowel movements, urination, or intercourse unless otherwise stated above.  Pertinent History Reviewed:  Reviewed past medical,surgical, social, obstetrical and family history.  Reviewed problem list, medications and allergies. Physical Assessment:   Vitals:   08/03/23 0851  BP: 118/84  Pulse: 80  There is no height or weight on file to calculate BMI.       Physical Examination:   General:  Alert, oriented and cooperative.   Mental Status: Normal mood and affect perceived. Normal judgment and thought content.  Physical exam deferred due to nature of the encounter  No results found for this or any previous visit (from the past 24 hours).  Assessment & Plan:  1) 12d s/p SVB after IOL for GHTN>  pumping & bottlefeeding (some formula b/c it satisfies baby better), milk supply is good  2) BP check> bp good, no meds  3) H/O anxiety> no meds, doing well, EPDS 4  4) Contraception counseling> abstinence until pp visit, plans POPs  Meds: No orders of the defined types were placed in this encounter.   No orders of the defined types were placed in this encounter.   I discussed the assessment and treatment plan with the patient. The patient was provided an opportunity to ask questions and all  were answered. The patient agreed with the plan and demonstrated an understanding of the instructions.   The patient was advised to call back or seek an in-person evaluation/go to the ED if the symptoms worsen or if the condition fails to improve as anticipated.  I provided 10 minutes of non-face-to-face time during this encounter.   Return for As scheduled.  Ferd Householder CNM, [facility] 08/03/2023 9:10 AM

## 2023-08-03 NOTE — Addendum Note (Signed)
 Addended by: Glinda Lapping R on: 08/03/2023 11:29 AM   Modules accepted: Level of Service

## 2023-08-26 ENCOUNTER — Ambulatory Visit: Admitting: Advanced Practice Midwife

## 2023-08-31 ENCOUNTER — Encounter: Payer: Self-pay | Admitting: Obstetrics & Gynecology

## 2023-08-31 ENCOUNTER — Other Ambulatory Visit (HOSPITAL_COMMUNITY): Payer: Self-pay

## 2023-08-31 ENCOUNTER — Ambulatory Visit: Admitting: Obstetrics & Gynecology

## 2023-08-31 ENCOUNTER — Other Ambulatory Visit: Payer: Self-pay

## 2023-08-31 MED ORDER — NORETHINDRONE 0.35 MG PO TABS
1.0000 | ORAL_TABLET | Freq: Every day | ORAL | 11 refills | Status: DC
  Filled 2023-08-31: qty 28, 28d supply, fill #0

## 2023-08-31 NOTE — Progress Notes (Signed)
 Subjective:     Veronica Allen is a 28 y.o. female who presents for a postpartum visit. She is 6 weeks postpartum following a spontaneous vaginal delivery. I have fully reviewed the prenatal and intrapartum course. The delivery was at 39 gestational weeks. Outcome: spontaneous vaginal delivery. Anesthesia: epidural. Postpartum course has been normal. Baby's course has been unremarkable. Baby is feeding by breast. Bleeding no bleeding. Bowel function is normal. Bladder function is normal. Patient is not sexually active. Contraception method is none. Postpartum depression screening: negative.  The following portions of the patient's history were reviewed and updated as appropriate: allergies, current medications, past family history, past medical history, past social history, past surgical history, and problem list.  Review of Systems Pertinent items are noted in HPI.   Objective:    BP 126/79 (BP Location: Right Arm, Patient Position: Sitting, Cuff Size: Normal)   Ht 5\' 3"  (1.6 m)   Wt 155 lb (70.3 kg)   LMP 10/08/2022   Breastfeeding Yes   BMI 27.46 kg/m   General:  alert, cooperative, and no distress   Breasts:    Lungs:   Heart:    Abdomen:    Vulva:    Vagina:   Cervix:    Corpus:   Adnexa:    Rectal Exam:         Assessment:    normal postpartum exam. Pap smear done at today's visit.   Plan:    1. Contraception: oral progesterone-only contraceptive 2.  3. Follow up in: prn  or as needed.

## 2023-09-07 ENCOUNTER — Encounter: Payer: Self-pay | Admitting: Family Medicine

## 2023-09-07 ENCOUNTER — Other Ambulatory Visit: Payer: Self-pay

## 2023-09-07 ENCOUNTER — Other Ambulatory Visit (HOSPITAL_COMMUNITY): Payer: Self-pay

## 2023-09-07 MED ORDER — VALACYCLOVIR HCL 1 G PO TABS
500.0000 mg | ORAL_TABLET | Freq: Every day | ORAL | 3 refills | Status: DC
  Filled 2023-09-07: qty 45, 90d supply, fill #0

## 2023-09-08 ENCOUNTER — Other Ambulatory Visit: Payer: Self-pay

## 2023-09-14 ENCOUNTER — Other Ambulatory Visit: Payer: Self-pay | Admitting: Nurse Practitioner

## 2023-09-14 ENCOUNTER — Other Ambulatory Visit (HOSPITAL_COMMUNITY): Payer: Self-pay

## 2023-09-14 MED ORDER — VALACYCLOVIR HCL 1 G PO TABS
1000.0000 mg | ORAL_TABLET | Freq: Every day | ORAL | 1 refills | Status: DC
  Filled 2023-09-14 – 2024-01-18 (×10): qty 90, 90d supply, fill #0

## 2023-09-15 ENCOUNTER — Encounter: Payer: Self-pay | Admitting: Obstetrics & Gynecology

## 2023-09-28 ENCOUNTER — Other Ambulatory Visit (HOSPITAL_COMMUNITY): Payer: Self-pay

## 2023-09-28 ENCOUNTER — Other Ambulatory Visit: Payer: Self-pay

## 2023-10-02 ENCOUNTER — Other Ambulatory Visit: Payer: Self-pay

## 2023-10-05 ENCOUNTER — Other Ambulatory Visit (HOSPITAL_COMMUNITY): Payer: Self-pay

## 2023-10-05 ENCOUNTER — Other Ambulatory Visit: Payer: Self-pay

## 2023-10-07 ENCOUNTER — Other Ambulatory Visit (HOSPITAL_COMMUNITY): Payer: Self-pay

## 2023-10-08 ENCOUNTER — Other Ambulatory Visit (HOSPITAL_COMMUNITY): Payer: Self-pay

## 2023-10-21 ENCOUNTER — Other Ambulatory Visit: Payer: Self-pay

## 2023-11-05 ENCOUNTER — Ambulatory Visit: Admitting: Obstetrics & Gynecology

## 2023-11-12 ENCOUNTER — Ambulatory Visit: Admitting: Family Medicine

## 2023-11-12 VITALS — BP 103/65 | HR 79 | Temp 98.2°F | Ht 63.0 in | Wt 152.6 lb

## 2023-11-12 DIAGNOSIS — L989 Disorder of the skin and subcutaneous tissue, unspecified: Secondary | ICD-10-CM

## 2023-11-12 DIAGNOSIS — Z0000 Encounter for general adult medical examination without abnormal findings: Secondary | ICD-10-CM | POA: Diagnosis not present

## 2023-11-12 DIAGNOSIS — D72829 Elevated white blood cell count, unspecified: Secondary | ICD-10-CM | POA: Diagnosis not present

## 2023-11-12 DIAGNOSIS — E785 Hyperlipidemia, unspecified: Secondary | ICD-10-CM | POA: Diagnosis not present

## 2023-11-12 NOTE — Progress Notes (Signed)
   Subjective:    Patient ID: Veronica Allen, female    DOB: Jul 31, 1995, 28 y.o.   MRN: [id number]  HPI The patient comes in today for a wellness visit.    A review of their health history was completed.  A review of medications was also completed.  Any needed refills; No  Eating habits: Good  Falls/  MVA accidents in past few months: No  Regular exercise:   Specialist pt sees on regular basis: No  Preventative health issues were discussed.   Additional concerns: spot on nose been there since 2023   Review of Systems     Objective:   Physical Exam General-in no acute distress Eyes-no discharge Lungs-respiratory rate normal, CTA CV-no murmurs,RRR Extremities skin warm dry no edema Neuro grossly normal Behavior normal, alert  There is a reddened area with telangiectasia on the nose concerning for possibility of early skin cancer      Assessment & Plan:  GYN portion of physical exam deferred to her gynecologist  1. Well adult exam (Primary) Adult wellness-complete.wellness physical was conducted today. Importance of diet and exercise were discussed in detail.  Importance of stress reduction and healthy living were discussed.  In addition to this a discussion regarding safety was also covered.  We also reviewed over immunizations and gave recommendations regarding current immunization needed for age.   In addition to this additional areas were also touched on including: Preventative health exams needed:  Colonoscopy not indicated  Patient was advised yearly wellness exam  - Comprehensive metabolic panel  2. Hyperlipidemia, unspecified hyperlipidemia type Check lipid profile has genetic tendency toward this - Lipid Panel  3. Leukocytosis, unspecified type Leukocytosis during pregnancy should be corrected now repeat CBC - CBC with Differential  4. Skin lesion Referral to dermatology for further evaluation [facility] - Ambulatory referral to  Dermatology

## 2023-11-26 ENCOUNTER — Encounter: Payer: Self-pay | Admitting: Obstetrics & Gynecology

## 2023-11-27 ENCOUNTER — Other Ambulatory Visit: Payer: Self-pay

## 2023-11-27 MED ORDER — DROSPIRENONE-ESTETROL 3-14.2 MG PO TABS
1.0000 | ORAL_TABLET | Freq: Every day | ORAL | 12 refills | Status: DC
  Filled 2023-11-27: qty 28, 28d supply, fill #0

## 2023-12-08 ENCOUNTER — Ambulatory Visit (HOSPITAL_COMMUNITY): Payer: Self-pay | Admitting: Obstetrics & Gynecology

## 2023-12-08 ENCOUNTER — Ambulatory Visit: Admitting: *Deleted

## 2023-12-08 DIAGNOSIS — Z3201 Encounter for pregnancy test, result positive: Secondary | ICD-10-CM | POA: Diagnosis not present

## 2023-12-08 LAB — POCT URINE PREGNANCY: Preg Test, Ur: POSITIVE — AB

## 2023-12-08 NOTE — Progress Notes (Signed)
   NURSE VISIT- PREGNANCY CONFIRMATION   SUBJECTIVE:  Veronica Allen is a 28 y.o. G2P1001 female at [redacted]w[redacted]d by certain LMP of Patient's last menstrual period was 10/21/2023. Here for pregnancy confirmation.  Home pregnancy test: positive x 1  She reports no complaints.  She is taking prenatal vitamins.    OBJECTIVE:  LMP 10/21/2023   Breastfeeding No   Appears well, in no apparent distress  Results for orders placed or performed in visit on 12/08/23 (from the past 24 hours)  POCT urine pregnancy   Collection Time: 12/08/23  9:50 AM  Result Value Ref Range   Preg Test, Ur Positive (A) Negative    ASSESSMENT: Positive pregnancy test, [redacted]w[redacted]d by LMP    PLAN: Schedule for dating ultrasound in 2 weeks Prenatal vitamins: continue   Nausea medicines: not currently needed   OB packet given: Yes  Alan LITTIE Fischer  12/08/2023 9:51 AM

## 2023-12-14 ENCOUNTER — Ambulatory Visit: Admitting: Obstetrics & Gynecology

## 2023-12-15 ENCOUNTER — Encounter: Payer: Self-pay | Admitting: Obstetrics & Gynecology

## 2023-12-16 ENCOUNTER — Other Ambulatory Visit: Payer: Self-pay | Admitting: Obstetrics & Gynecology

## 2023-12-16 ENCOUNTER — Ambulatory Visit (INDEPENDENT_AMBULATORY_CARE_PROVIDER_SITE_OTHER)

## 2023-12-16 ENCOUNTER — Encounter (HOSPITAL_COMMUNITY): Payer: Self-pay

## 2023-12-16 ENCOUNTER — Other Ambulatory Visit (HOSPITAL_COMMUNITY): Payer: Self-pay

## 2023-12-16 DIAGNOSIS — O3680X0 Pregnancy with inconclusive fetal viability, not applicable or unspecified: Secondary | ICD-10-CM

## 2023-12-16 DIAGNOSIS — Z3A08 8 weeks gestation of pregnancy: Secondary | ICD-10-CM | POA: Diagnosis not present

## 2023-12-16 DIAGNOSIS — Z3491 Encounter for supervision of normal pregnancy, unspecified, first trimester: Secondary | ICD-10-CM | POA: Diagnosis not present

## 2023-12-16 MED ORDER — DOXYLAMINE-PYRIDOXINE 10-10 MG PO TBEC
2.0000 | DELAYED_RELEASE_TABLET | Freq: Two times a day (BID) | ORAL | 3 refills | Status: AC
  Filled 2023-12-16: qty 100, 25d supply, fill #0
  Filled 2024-01-07: qty 60, 15d supply, fill #0

## 2023-12-16 NOTE — Progress Notes (Signed)
 US  8 wks,single IUP with yolk sac,FHR 150 bpm,normal ovaries,CRL 12.01 mm

## 2023-12-21 ENCOUNTER — Other Ambulatory Visit: Payer: Self-pay

## 2023-12-21 MED ORDER — ONDANSETRON 4 MG PO TBDP
ORAL_TABLET | ORAL | 1 refills | Status: DC
  Filled 2023-12-21 – 2024-01-07 (×2): qty 24, 7d supply, fill #0
  Filled 2024-01-19: qty 24, 4d supply, fill #0

## 2023-12-21 NOTE — Addendum Note (Signed)
 Addended by: Owais Pruett H on: 12/21/2023 09:35 AM   Modules accepted: Orders

## 2023-12-29 ENCOUNTER — Other Ambulatory Visit

## 2024-01-07 ENCOUNTER — Other Ambulatory Visit (HOSPITAL_BASED_OUTPATIENT_CLINIC_OR_DEPARTMENT_OTHER): Payer: Self-pay

## 2024-01-13 ENCOUNTER — Encounter: Admitting: *Deleted

## 2024-01-13 ENCOUNTER — Encounter: Admitting: Women's Health

## 2024-01-14 ENCOUNTER — Encounter: Payer: Self-pay | Admitting: Women's Health

## 2024-01-14 DIAGNOSIS — Z8759 Personal history of other complications of pregnancy, childbirth and the puerperium: Secondary | ICD-10-CM | POA: Insufficient documentation

## 2024-01-14 DIAGNOSIS — O09899 Supervision of other high risk pregnancies, unspecified trimester: Secondary | ICD-10-CM | POA: Insufficient documentation

## 2024-01-14 DIAGNOSIS — Z3490 Encounter for supervision of normal pregnancy, unspecified, unspecified trimester: Secondary | ICD-10-CM | POA: Insufficient documentation

## 2024-01-18 ENCOUNTER — Other Ambulatory Visit (HOSPITAL_COMMUNITY)
Admission: RE | Admit: 2024-01-18 | Discharge: 2024-01-18 | Disposition: A | Discharge status: Home / Self Care | Source: Ambulatory Visit | Attending: Women's Health | Admitting: Women's Health

## 2024-01-18 ENCOUNTER — Other Ambulatory Visit: Payer: Self-pay

## 2024-01-18 ENCOUNTER — Ambulatory Visit: Admitting: Women's Health

## 2024-01-18 ENCOUNTER — Encounter: Payer: Self-pay | Admitting: Women's Health

## 2024-01-18 ENCOUNTER — Other Ambulatory Visit (HOSPITAL_BASED_OUTPATIENT_CLINIC_OR_DEPARTMENT_OTHER): Payer: Self-pay

## 2024-01-18 ENCOUNTER — Encounter: Admitting: *Deleted

## 2024-01-18 VITALS — BP 120/83 | HR 92 | Wt 146.0 lb

## 2024-01-18 DIAGNOSIS — O09891 Supervision of other high risk pregnancies, first trimester: Secondary | ICD-10-CM

## 2024-01-18 DIAGNOSIS — Z113 Encounter for screening for infections with a predominantly sexual mode of transmission: Secondary | ICD-10-CM | POA: Insufficient documentation

## 2024-01-18 DIAGNOSIS — Z131 Encounter for screening for diabetes mellitus: Secondary | ICD-10-CM

## 2024-01-18 DIAGNOSIS — O09899 Supervision of other high risk pregnancies, unspecified trimester: Secondary | ICD-10-CM

## 2024-01-18 DIAGNOSIS — Z3480 Encounter for supervision of other normal pregnancy, unspecified trimester: Secondary | ICD-10-CM | POA: Diagnosis not present

## 2024-01-18 DIAGNOSIS — Z3A12 12 weeks gestation of pregnancy: Secondary | ICD-10-CM | POA: Insufficient documentation

## 2024-01-18 DIAGNOSIS — Z8759 Personal history of other complications of pregnancy, childbirth and the puerperium: Secondary | ICD-10-CM | POA: Diagnosis not present

## 2024-01-18 DIAGNOSIS — Z3481 Encounter for supervision of other normal pregnancy, first trimester: Secondary | ICD-10-CM

## 2024-01-18 MED ORDER — ASPIRIN 81 MG PO TBEC
162.0000 mg | DELAYED_RELEASE_TABLET | Freq: Every day | ORAL | 2 refills | Status: AC
Start: 2024-01-18 — End: ?
  Filled 2024-01-18 (×2): qty 180, 90d supply, fill #0

## 2024-01-18 NOTE — Patient Instructions (Signed)
Rinoa, thank you for choosing our office today! We appreciate the opportunity to meet your healthcare needs. You may receive a short survey by mail, e-mail, or through MyChart. If you are happy with your care we would appreciate if you could take just a few minutes to complete the survey questions. We read all of your comments and take your feedback very seriously. Thank you again for choosing our office.  [facility] ([address] [place], [place] [address]) Entrance C, located off of [address] Free 24/7 valet parking   Nausea & Vomiting  Have saltine crackers or pretzels by your bed and eat a few bites before you raise your head out of bed in the morning  Eat small frequent meals throughout the day instead of large meals  Drink plenty of fluids throughout the day to stay hydrated, just don't drink a lot of fluids with your meals.  This can make your stomach fill up faster making you feel sick  Do not brush your teeth right after you eat  Products with real ginger are good for nausea, like ginger ale and ginger hard candy Make sure it says made with real ginger!  Sucking on sour candy like lemon heads is also good for nausea  If your prenatal vitamins make you nauseated, take them at night so you will sleep through the nausea  Sea Bands  If you feel like you need medicine for the nausea & vomiting please let us know  If you are unable to keep any fluids or food down please let us know   Constipation  Drink plenty of fluid, preferably water, throughout the day  Eat foods high in fiber such as fruits, vegetables, and grains  Exercise, such as walking, is a good way to keep your bowels regular  Drink warm fluids, especially warm prune juice, or decaf coffee  Eat a 1/2 cup of real oatmeal (not instant), 1/2 cup applesauce, and 1/2-1 cup warm prune juice every day  If needed, you may take Colace  (docusate sodium) stool softener once or twice a day to help keep the stool soft.   If you still are having problems with constipation, you may take Miralax once daily as needed to help keep your bowels regular.   Home Blood Pressure Monitoring for Patients   Your provider has recommended that you check your blood pressure (BP) at least once a week at home. If you do not have a blood pressure cuff at home, one will be provided for you. Contact your provider if you have not received your monitor within 1 week.   Helpful Tips for Accurate Home Blood Pressure Checks  . Don't smoke, exercise, or drink caffeine 30 minutes before checking your BP . Use the restroom before checking your BP (a full bladder can raise your pressure) . Relax in a comfortable upright chair . Feet on the ground . Left arm resting comfortably on a flat surface at the level of your heart . Legs uncrossed . Back supported . Sit quietly and don't talk . Place the cuff on your bare arm . Adjust snuggly, so that only two fingertips can fit between your skin and the top of the cuff . Check 2 readings separated by at least one minute . Keep a log of your BP readings . For a visual, please reference this diagram: http://ccnc.care/bpdiagram  Provider Name: [facility]       Phone: [phone]  Zone 1: ALL CLEAR  Continue to monitor your symptoms:  . BP reading is less than 140 (top number) or less than 90 (bottom number)  . No right upper stomach pain . No headaches or seeing spots . No feeling nauseated or throwing up . No swelling in face and hands  Zone 2: CAUTION Call your doctor's office for any of the following:  . BP reading is greater than 140 (top number) or greater than 90 (bottom number)  . Stomach pain under your ribs in the middle or right side . Headaches or seeing spots . Feeling nauseated or throwing up . Swelling in face and hands  Zone 3: EMERGENCY  Seek immediate medical care if you have  any of the following:  . BP reading is greater than160 (top number) or greater than 110 (bottom number) . Severe headaches not improving with Tylenol . Serious difficulty catching your breath . Any worsening symptoms from Zone 2    First Trimester of Pregnancy The first trimester of pregnancy is from week 1 until the end of week 12 (months 1 through 3). A week after a sperm fertilizes an egg, the egg will implant on the wall of the uterus. This embryo will begin to develop into a baby. Genes from you and your partner are forming the baby. The female genes determine whether the baby is a boy or a girl. At 6-8 weeks, the eyes and face are formed, and the heartbeat can be seen on ultrasound. At the end of 12 weeks, all the baby's organs are formed.  Now that you are pregnant, you will want to do everything you can to have a healthy baby. Two of the most important things are to get good prenatal care and to follow your health care provider's instructions. Prenatal care is all the medical care you receive before the baby's birth. This care will help prevent, find, and treat any problems during the pregnancy and childbirth. BODY CHANGES Your body goes through many changes during pregnancy. The changes vary from woman to woman.   You may gain or lose a couple of pounds at first.  You may feel sick to your stomach (nauseous) and throw up (vomit). If the vomiting is uncontrollable, call your health care provider.  You may tire easily.  You may develop headaches that can be relieved by medicines approved by your health care provider.  You may urinate more often. Painful urination may mean you have a bladder infection.  You may develop heartburn as a result of your pregnancy.  You may develop constipation because certain hormones are causing the muscles that push waste through your intestines to slow down.  You may develop hemorrhoids or swollen, bulging veins (varicose veins).  Your breasts may begin  to grow larger and become tender. Your nipples may stick out more, and the tissue that surrounds them (areola) may become darker.  Your gums may bleed and may be sensitive to brushing and flossing.  Dark spots or blotches (chloasma, mask of pregnancy) may develop on your face. This will likely fade after the baby is born.  Your menstrual periods will stop.  You may have a loss of appetite.  You may develop cravings for certain kinds of food.  You may have changes in your emotions from day to day, such as being excited to be pregnant or being concerned that something may go wrong with the pregnancy and baby.  You may have more vivid and strange   dreams.  You may have changes in your hair. These can include thickening of your hair, rapid growth, and changes in texture. Some women also have hair loss during or after pregnancy, or hair that feels dry or thin. Your hair will most likely return to normal after your baby is born. WHAT TO EXPECT AT YOUR PRENATAL VISITS During a routine prenatal visit:  You will be weighed to make sure you and the baby are growing normally.  Your blood pressure will be taken.  Your abdomen will be measured to track your baby's growth.  The fetal heartbeat will be listened to starting around week 10 or 12 of your pregnancy.  Test results from any previous visits will be discussed. Your health care provider may ask you:  How you are feeling.  If you are feeling the baby move.  If you have had any abnormal symptoms, such as leaking fluid, bleeding, severe headaches, or abdominal cramping.  If you have any questions. Other tests that may be performed during your first trimester include:  Blood tests to find your blood type and to check for the presence of any previous infections. They will also be used to check for low iron levels (anemia) and Rh antibodies. Later in the pregnancy, blood tests for diabetes will be done along with other tests if problems  develop.  Urine tests to check for infections, diabetes, or protein in the urine.  An ultrasound to confirm the proper growth and development of the baby.  An amniocentesis to check for possible genetic problems.  Fetal screens for spina bifida and Down syndrome.  You may need other tests to make sure you and the baby are doing well. HOME CARE INSTRUCTIONS  Medicines  Follow your health care provider's instructions regarding medicine use. Specific medicines may be either safe or unsafe to take during pregnancy.  Take your prenatal vitamins as directed.  If you develop constipation, try taking a stool softener if your health care provider approves. Diet  Eat regular, well-balanced meals. Choose a variety of foods, such as meat or vegetable-based protein, fish, milk and low-fat dairy products, vegetables, fruits, and whole grain breads and cereals. Your health care provider will help you determine the amount of weight gain that is right for you.  Avoid raw meat and uncooked cheese. These carry germs that can cause birth defects in the baby.  Eating four or five small meals rather than three large meals a day may help relieve nausea and vomiting. If you start to feel nauseous, eating a few soda crackers can be helpful. Drinking liquids between meals instead of during meals also seems to help nausea and vomiting.  If you develop constipation, eat more high-fiber foods, such as fresh vegetables or fruit and whole grains. Drink enough fluids to keep your urine clear or pale yellow. Activity and Exercise  Exercise only as directed by your health care provider. Exercising will help you:  Control your weight.  Stay in shape.  Be prepared for labor and delivery.  Experiencing pain or cramping in the lower abdomen or low back is a good sign that you should stop exercising. Check with your health care provider before continuing normal exercises.  Try to avoid standing for long periods of  time. Move your legs often if you must stand in one place for a long time.  Avoid heavy lifting.  Wear low-heeled shoes, and practice good posture.  You may continue to have sex unless your health care provider directs   you otherwise. Relief of Pain or Discomfort  Wear a good support bra for breast tenderness.    Take warm sitz baths to soothe any pain or discomfort caused by hemorrhoids. Use hemorrhoid cream if your health care provider approves.    Rest with your legs elevated if you have leg cramps or low back pain.  If you develop varicose veins in your legs, wear support hose. Elevate your feet for 15 minutes, 3-4 times a day. Limit salt in your diet. Prenatal Care  Schedule your prenatal visits by the twelfth week of pregnancy. They are usually scheduled monthly at first, then more often in the last 2 months before delivery.  Write down your questions. Take them to your prenatal visits.  Keep all your prenatal visits as directed by your health care provider. Safety  Wear your seat belt at all times when driving.  Make a list of emergency phone numbers, including numbers for family, friends, the hospital, and police and fire departments. General Tips  Ask your health care provider for a referral to a local prenatal education class. Begin classes no later than at the beginning of month 6 of your pregnancy.  Ask for help if you have counseling or nutritional needs during pregnancy. Your health care provider can offer advice or refer you to specialists for help with various needs.  Do not use hot tubs, steam rooms, or saunas.  Do not douche or use tampons or scented sanitary pads.  Do not cross your legs for long periods of time.  Avoid cat litter boxes and soil used by cats. These carry germs that can cause birth defects in the baby and possibly loss of the fetus by miscarriage or stillbirth.  Avoid all smoking, herbs, alcohol, and medicines not prescribed by your health  care provider. Chemicals in these affect the formation and growth of the baby.  Schedule a dentist appointment. At home, brush your teeth with a soft toothbrush and be gentle when you floss. SEEK MEDICAL CARE IF:   You have dizziness.  You have mild pelvic cramps, pelvic pressure, or nagging pain in the abdominal area.  You have persistent nausea, vomiting, or diarrhea.  You have a bad smelling vaginal discharge.  You have pain with urination.  You notice increased swelling in your face, hands, legs, or ankles. SEEK IMMEDIATE MEDICAL CARE IF:   You have a fever.  You are leaking fluid from your vagina.  You have spotting or bleeding from your vagina.  You have severe abdominal cramping or pain.  You have rapid weight gain or loss.  You vomit blood or material that looks like coffee grounds.  You are exposed to German measles and have never had them.  You are exposed to fifth disease or chickenpox.  You develop a severe headache.  You have shortness of breath.  You have any kind of trauma, such as from a fall or a car accident. Document Released: 03/04/2001 Document Revised: 07/25/2013 Document Reviewed: 01/18/2013 ExitCare Patient Information 2015 [employer]. This information is not intended to replace advice given to you by your health care provider. Make sure you discuss any questions you have with your health care provider.   

## 2024-01-18 NOTE — Progress Notes (Signed)
 INITIAL OBSTETRICAL VISIT Patient name: Veronica Allen MRN [id number]  Date of birth: 02-Nov-1995 Chief Complaint:   Initial Prenatal Visit  History of Present Illness:   Veronica Allen is a 28 y.o. G72P1001 Caucasian female at [redacted]w[redacted]d by LMP c/w u/s at 7 weeks with an Estimated Date of Delivery: 07/27/24 being seen today for her initial obstetrical visit.   Patient's last menstrual period was 10/21/2023. Her obstetrical history is significant for term SVB (4180g) x 1, no GDM/SD April 2025.   Today she reports some nausea, vitb6/unisom  helps.  Last pap 11/11/22. Results were: NILM w/ HRHPV negative     01/18/2024   11:45 AM 11/12/2023    2:28 PM 01/12/2023    9:32 AM 11/11/2022   11:15 AM 07/07/2022   10:13 AM  Depression screen PHQ 2/9  Decreased Interest 0 0 0 0 0  Down, Depressed, Hopeless 0 0 0 0 0  PHQ - 2 Score 0 0 0 0 0  Altered sleeping 0 0 0 0 0  Tired, decreased energy 0 0 0 0 0  Change in appetite 0 0 0 0 0  Feeling bad or failure about yourself  0 0 0 0 0  Trouble concentrating 0 0 0 0 0  Moving slowly or fidgety/restless 0 0 0 0 0  Suicidal thoughts 0 0 0 0 0  PHQ-9 Score 0 0 0 0 0        01/18/2024   11:45 AM 11/12/2023    2:28 PM 01/12/2023    9:33 AM 11/11/2022   11:15 AM  GAD 7 : Generalized Anxiety Score  Nervous, Anxious, on Edge 1 1 0 1  Control/stop worrying 0 1 0 0  Worry too much - different things 1 1 0 0  Trouble relaxing 0 0 0 0  Restless 0 0 0 0  Easily annoyed or irritable 0 0 0 0  Afraid - awful might happen 0 1 0 0  Total GAD 7 Score 2 4 0 1  Anxiety Difficulty  Not difficult at all       Review of Systems:   Pertinent items are noted in HPI Denies cramping/contractions, leakage of fluid, vaginal bleeding, abnormal vaginal discharge w/ itching/odor/irritation, headaches, visual changes, shortness of breath, chest pain, abdominal pain, severe nausea/vomiting, or problems with urination or bowel movements unless otherwise stated  above.  Pertinent History Reviewed:  Reviewed past medical,surgical, social, obstetrical and family history.  Reviewed problem list, medications and allergies. OB History  Gravida Para Term Preterm AB Living  2 1 1  0 0 1  SAB IAB Ectopic Multiple Live Births  0 0 0 0 1    # Outcome Date GA Lbr Len/2nd Weight Sex Type Anes PTL Lv  2 Current           1 Term 07/22/23 [redacted]w[redacted]d 09:48 / 03:48 9 lb 3.4 oz (4.18 kg) M Vag-Spont EPI N LIV     Birth Comments: brusing on chest and around mouth     Complications: Gestational hypertension   Physical Assessment:   Vitals:   01/18/24 1136  BP: 120/83  Pulse: 92  Weight: 146 lb (66.2 kg)  Body mass index is 25.86 kg/m.       Physical Examination:  General appearance - well appearing, and in no distress  Mental status - alert, oriented to person, place, and time  Psych:  She has a normal mood and affect  Skin - warm and dry, normal color, no  suspicious lesions noted  Chest - effort normal, all lung fields clear to auscultation bilaterally  Heart - normal rate and regular rhythm  Abdomen - soft, nontender  Extremities:  No swelling or varicosities noted  Thin prep pap is not done   Chaperone: N/A  TODAY'S Informal TA u/s: +FCA  No results found for this or any previous visit (from the past 24 hours).  Assessment & Plan:  1) Low-Risk Pregnancy G2P1001 at [redacted]w[redacted]d with an Estimated Date of Delivery: 07/27/24   2) Initial OB visit  3) Short interval pregnancy> last birth 07/22/23  4) H/O GHTN> ASA, baseline labs  5) H/O trivial MVR> EF 55%, repeat no MVR  6) H/O LGA> no GDM/dystocia  Meds:  Meds ordered this encounter  Medications   aspirin EC 81 MG tablet    Sig: Take 2 tablets (162 mg total) by mouth daily. Swallow whole.    Dispense:  180 tablet    Refill:  2    Initial labs obtained Continue prenatal vitamins Reviewed n/v relief measures and warning s/s to report Reviewed recommended weight gain based on pre-gravid  BMI Encouraged well-balanced diet Genetic & carrier screening discussed: requests Panorama and AFP Ultrasound discussed; fetal survey: requested CCNC completed> form faxed if has or is planning to apply for medicaid The nature of [place] [facility] [employer] with multiple MDs and other Advanced Practice Providers was explained to patient; also emphasized that fellows, residents, and students are part of our team. Does have home bp cuff. Office bp cuff given: no. Rx sent: n/a. Check bp weekly, let us  know if consistently >140/90.   Follow-up: Return in about 4 weeks (around 02/15/2024) for LROB, AFP, CNM, in person; then @ 20w for anatomy u/s and LROB w/ CNM.   Orders Placed This Encounter  Procedures   Urine Culture   CBC/D/Plt+RPR+Rh+ABO+RubIgG...   Hemoglobin A1c   PANORAMA PRENATAL TEST   Protein / creatinine ratio, urine   Comprehensive metabolic panel with GFR    Suzen JONELLE Fetters CNM, WHNP-BC 01/18/2024 12:03 PM

## 2024-01-19 LAB — CERVICOVAGINAL ANCILLARY ONLY
Chlamydia: NEGATIVE
Comment: NEGATIVE
Comment: NORMAL
Neisseria Gonorrhea: NEGATIVE

## 2024-01-19 LAB — HCV RT-PCR, QUANT (NON-GRAPH)

## 2024-01-20 ENCOUNTER — Other Ambulatory Visit (HOSPITAL_BASED_OUTPATIENT_CLINIC_OR_DEPARTMENT_OTHER): Payer: Self-pay

## 2024-01-20 LAB — URINE CULTURE

## 2024-01-21 ENCOUNTER — Ambulatory Visit: Payer: Self-pay | Admitting: Women's Health

## 2024-01-21 LAB — COMPREHENSIVE METABOLIC PANEL WITH GFR
ALT: 11 IU/L (ref 0–32)
AST: 14 IU/L (ref 0–40)
Albumin: 4 g/dL (ref 4.0–5.0)
Alkaline Phosphatase: 67 IU/L (ref 41–116)
BUN/Creatinine Ratio: 9 (ref 9–23)
BUN: 5 mg/dL — ABNORMAL LOW (ref 6–20)
Bilirubin Total: 0.3 mg/dL (ref 0.0–1.2)
CO2: 20 mmol/L (ref 20–29)
Calcium: 9.5 mg/dL (ref 8.7–10.2)
Chloride: 100 mmol/L (ref 96–106)
Creatinine, Ser: 0.54 mg/dL — ABNORMAL LOW (ref 0.57–1.00)
Globulin, Total: 2.7 g/dL (ref 1.5–4.5)
Glucose: 85 mg/dL (ref 70–99)
Potassium: 4.2 mmol/L (ref 3.5–5.2)
Sodium: 137 mmol/L (ref 134–144)
Total Protein: 6.7 g/dL (ref 6.0–8.5)
eGFR: 128 mL/min/1.73 (ref >59)

## 2024-01-21 LAB — PROTEIN / CREATININE RATIO, URINE
Creatinine, Urine: 103.1 mg/dL
Protein, Ur: 12.4 mg/dL
Protein/Creat Ratio: 120 mg/g{creat} (ref 0–200)

## 2024-01-21 LAB — CBC/D/PLT+RPR+RH+ABO+RUBIGG...
Antibody Screen: NEGATIVE
Basophils Absolute: 0.1 x10E3/uL (ref 0.0–0.2)
Basos: 1 %
EOS (ABSOLUTE): 0.3 x10E3/uL (ref 0.0–0.4)
Eos: 3 %
HCV Ab: REACTIVE — AB
HIV Screen 4th Generation wRfx: NONREACTIVE
Hematocrit: 39.6 % (ref 34.0–46.6)
Hemoglobin: 13.1 g/dL (ref 11.1–15.9)
Hepatitis B Surface Ag: NEGATIVE
Immature Grans (Abs): 0 x10E3/uL (ref 0.0–0.1)
Immature Granulocytes: 0 %
Lymphocytes Absolute: 2.4 x10E3/uL (ref 0.7–3.1)
Lymphs: 23 %
MCH: 29.5 pg (ref 26.6–33.0)
MCHC: 33.1 g/dL (ref 31.5–35.7)
MCV: 89 fL (ref 79–97)
Monocytes Absolute: 0.7 x10E3/uL (ref 0.1–0.9)
Monocytes: 7 %
Neutrophils Absolute: 6.8 x10E3/uL (ref 1.4–7.0)
Neutrophils: 66 %
Platelets: 380 x10E3/uL (ref 150–450)
RBC: 4.44 x10E6/uL (ref 3.77–5.28)
RDW: 11.8 % (ref 11.7–15.4)
RPR Ser Ql: NONREACTIVE
Rh Factor: NEGATIVE
Rubella Antibodies, IGG: 1.26 {index} (ref >0.99)
WBC: 10.4 x10E3/uL (ref 3.4–10.8)

## 2024-01-21 LAB — HCV RT-PCR, QUANT (NON-GRAPH): Hepatitis C Quantitation: NOT DETECTED [IU]/mL

## 2024-01-21 LAB — HEMOGLOBIN A1C
Est. average glucose Bld gHb Est-mCnc: 111 mg/dL
Hgb A1c MFr Bld: 5.5 % (ref 4.8–5.6)

## 2024-01-24 LAB — PANORAMA PRENATAL TEST FULL PANEL:PANORAMA TEST PLUS 5 ADDITIONAL MICRODELETIONS: FETAL FRACTION: 10.5

## 2024-02-01 ENCOUNTER — Ambulatory Visit (INDEPENDENT_AMBULATORY_CARE_PROVIDER_SITE_OTHER): Admitting: *Deleted

## 2024-02-01 VITALS — BP 126/76 | HR 101

## 2024-02-01 DIAGNOSIS — O9A212 Injury, poisoning and certain other consequences of external causes complicating pregnancy, second trimester: Secondary | ICD-10-CM

## 2024-02-01 DIAGNOSIS — Z3480 Encounter for supervision of other normal pregnancy, unspecified trimester: Secondary | ICD-10-CM

## 2024-02-01 DIAGNOSIS — Z3A14 14 weeks gestation of pregnancy: Secondary | ICD-10-CM
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
## 2024-02-15 ENCOUNTER — Encounter: Admitting: Obstetrics & Gynecology

## 2024-02-15 ENCOUNTER — Encounter: Payer: Self-pay | Admitting: Women's Health

## 2024-02-15 ENCOUNTER — Ambulatory Visit (INDEPENDENT_AMBULATORY_CARE_PROVIDER_SITE_OTHER): Admitting: Women's Health

## 2024-02-15 VITALS — BP 129/81 | HR 99 | Wt 148.0 lb

## 2024-02-15 DIAGNOSIS — Z8759 Personal history of other complications of pregnancy, childbirth and the puerperium: Secondary | ICD-10-CM

## 2024-02-15 DIAGNOSIS — Z1159 Encounter for screening for other viral diseases: Secondary | ICD-10-CM

## 2024-02-15 DIAGNOSIS — Z3482 Encounter for supervision of other normal pregnancy, second trimester: Secondary | ICD-10-CM

## 2024-02-15 DIAGNOSIS — Z3A16 16 weeks gestation of pregnancy: Secondary | ICD-10-CM

## 2024-02-15 DIAGNOSIS — Z363 Encounter for antenatal screening for malformations: Secondary | ICD-10-CM

## 2024-02-15 DIAGNOSIS — Z3480 Encounter for supervision of other normal pregnancy, unspecified trimester: Secondary | ICD-10-CM

## 2024-02-15 DIAGNOSIS — Z36 Encounter for antenatal screening for chromosomal anomalies: Secondary | ICD-10-CM | POA: Diagnosis not present

## 2024-02-15 DIAGNOSIS — Z1379 Encounter for other screening for genetic and chromosomal anomalies: Secondary | ICD-10-CM

## 2024-02-15 NOTE — Patient Instructions (Signed)
Veronica Allen, thank you for choosing our office today! We appreciate the opportunity to meet your healthcare needs. You may receive a short survey by mail, e-mail, or through [employer]. If you are happy with your care we would appreciate if you could take just a few minutes to complete the survey questions. We read all of your comments and take your feedback very seriously. Thank you again for choosing our office.  Center for [employer] Team at [place] at [facility] ([place], [place] [address]) Entrance C, located off of [address] parking  Go to [email or web address] to register for FREE online childbirth classes  Call the office [phone]) or go to [facility] if: You begin to severe cramping Your water breaks.  Sometimes it is a big gush of fluid, sometimes it is just a trickle that keeps getting your panties wet or running down your legs You have vaginal bleeding.  It is normal to have a small amount of spotting if your cervix was checked.   [facility] Pediatricians/Family Doctors [facility]): [address] [place], [place] [facility]: [address] [place], [phone]                [facility]): [place], [phone] (call to ask if accepting patients) [facility] Department: [address], [place], [place] Pediatricians/Family Doctors [facility]): [place]. [place], [address], [place] [facility]: [address] [place], [place] [facility]: [place], [place] [facility]): [phone] [facility]: [address] [place]: [address] [place] [facility]: [phone], [phone]  Home Blood Pressure Monitoring for Patients   Your provider has recommended that you check your blood pressure (BP) at least once a week at home. If you do not have a blood pressure cuff at home, one will be provided for you. Contact your provider if you have not received your monitor within 1 week.   Helpful Tips for Accurate Home Blood Pressure Checks  Don't smoke, exercise, or drink caffeine 30 minutes before checking your BP Use the restroom before checking your BP (a full bladder can raise your pressure) Relax in a comfortable upright chair Feet on the ground Left arm resting comfortably on a flat surface at the level of your heart Legs uncrossed Back supported Sit quietly and don't talk Place the cuff on your bare arm Adjust snuggly, so that only two fingertips can fit between your skin and the top of the cuff Check 2 readings separated by at least one minute Keep a log of your BP readings For a visual, please reference this diagram: http://ccnc.care/bpdiagram  Provider Name: [facility]     Phone: [phone]  Zone 1: ALL CLEAR  Continue to monitor your symptoms:  BP reading is less than 140 (top number) or less than 90 (bottom number)  No right upper stomach pain No headaches or seeing spots No feeling nauseated or throwing up No swelling in face and hands  Zone 2: CAUTION Call your doctor's office for any of the following:  BP reading is greater than 140 (top number) or greater than  90 (bottom number)  Stomach pain under your ribs in the middle or right side Headaches or seeing spots Feeling nauseated or throwing up Swelling in face and hands  Zone 3: EMERGENCY  Seek immediate medical care if you have any of the following:  BP reading is greater than160 (top number) or greater than 110 (bottom number) Severe headaches not improving with Tylenol Serious difficulty catching your breath Any worsening symptoms from  Zone 2     Second Trimester of Pregnancy The second trimester is from week 14 through week 27 (months 4 through 6). The second trimester is often a time when you feel your best. Your body has adjusted to being pregnant, and you begin to feel better physically. Usually, morning sickness has lessened or quit completely, you may have more energy, and you may have an increase in appetite. The second trimester is also a time when the fetus is growing rapidly. At the end of the sixth month, the fetus is about 9 inches long and weighs about 1 pounds. You will likely begin to feel the baby move (quickening) between 16 and 20 weeks of pregnancy. Body changes during your second trimester Your body continues to go through many changes during your second trimester. The changes vary from woman to woman. Your weight will continue to increase. You will notice your lower abdomen bulging out. You may begin to get stretch marks on your hips, abdomen, and breasts. You may develop headaches that can be relieved by medicines. The medicines should be approved by your health care provider. You may urinate more often because the fetus is pressing on your bladder. You may develop or continue to have heartburn as a result of your pregnancy. You may develop constipation because certain hormones are causing the muscles that push waste through your intestines to slow down. You may develop hemorrhoids or swollen, bulging veins (varicose veins). You may have back pain. This is caused by: Weight gain. Pregnancy hormones that are relaxing the joints in your pelvis. A shift in weight and the muscles that support your balance. Your breasts will continue to grow and they will continue to become tender. Your gums may bleed and may be sensitive to brushing and flossing. Dark spots or blotches (chloasma, mask of pregnancy) may develop on your face. This will likely fade after the baby is born. A dark line from your belly button to  the pubic area (linea nigra) may appear. This will likely fade after the baby is born. You may have changes in your hair. These can include thickening of your hair, rapid growth, and changes in texture. Some women also have hair loss during or after pregnancy, or hair that feels dry or thin. Your hair will most likely return to normal after your baby is born.  What to expect at prenatal visits During a routine prenatal visit: You will be weighed to make sure you and the fetus are growing normally. Your blood pressure will be taken. Your abdomen will be measured to track your baby's growth. The fetal heartbeat will be listened to. Any test results from the previous visit will be discussed.  Your health care provider may ask you: How you are feeling. If you are feeling the baby move. If you have had any abnormal symptoms, such as leaking fluid, bleeding, severe headaches, or abdominal cramping. If you are using any tobacco products, including cigarettes, chewing tobacco, and electronic cigarettes. If you have any questions.  Other tests that may be performed during   your second trimester include: Blood tests that check for: Low iron levels (anemia). High blood sugar that affects pregnant women (gestational diabetes) between 41 and 28 weeks. Rh antibodies. This is to check for a protein on red blood cells (Rh factor). Urine tests to check for infections, diabetes, or protein in the urine. An ultrasound to confirm the proper growth and development of the baby. An amniocentesis to check for possible genetic problems. Fetal screens for spina bifida and Down syndrome. HIV (human immunodeficiency virus) testing. Routine prenatal testing includes screening for HIV, unless you choose not to have this test.  Follow these instructions at home: Medicines Follow your health care provider's instructions regarding medicine use. Specific medicines may be either safe or unsafe to take during  pregnancy. Take a prenatal vitamin that contains at least 600 micrograms (mcg) of folic acid. If you develop constipation, try taking a stool softener if your health care provider approves. Eating and drinking Eat a balanced diet that includes fresh fruits and vegetables, whole grains, good sources of protein such as meat, eggs, or tofu, and low-fat dairy. Your health care provider will help you determine the amount of weight gain that is right for you. Avoid raw meat and uncooked cheese. These carry germs that can cause birth defects in the baby. If you have low calcium intake from food, talk to your health care provider about whether you should take a daily calcium supplement. Limit foods that are high in fat and processed sugars, such as fried and sweet foods. To prevent constipation: Drink enough fluid to keep your urine clear or pale yellow. Eat foods that are high in fiber, such as fresh fruits and vegetables, whole grains, and beans. Activity Exercise only as directed by your health care provider. Most women can continue their usual exercise routine during pregnancy. Try to exercise for 30 minutes at least 5 days a week. Stop exercising if you experience uterine contractions. Avoid heavy lifting, wear low heel shoes, and practice good posture. A sexual relationship may be continued unless your health care provider directs you otherwise. Relieving pain and discomfort Wear a good support bra to prevent discomfort from breast tenderness. Take warm sitz baths to soothe any pain or discomfort caused by hemorrhoids. Use hemorrhoid cream if your health care provider approves. Rest with your legs elevated if you have leg cramps or low back pain. If you develop varicose veins, wear support hose. Elevate your feet for 15 minutes, 3-4 times a day. Limit salt in your diet. Prenatal Care Write down your questions. Take them to your prenatal visits. Keep all your prenatal visits as told by your health  care provider. This is important. Safety Wear your seat belt at all times when driving. Make a list of emergency phone numbers, including numbers for family, friends, the hospital, and police and fire departments. General instructions Ask your health care provider for a referral to a local prenatal education class. Begin classes no later than the beginning of month 6 of your pregnancy. Ask for help if you have counseling or nutritional needs during pregnancy. Your health care provider can offer advice or refer you to specialists for help with various needs. Do not use hot tubs, steam rooms, or saunas. Do not douche or use tampons or scented sanitary pads. Do not cross your legs for long periods of time. Avoid cat litter boxes and soil used by cats. These carry germs that can cause birth defects in the baby and possibly loss of the  fetus by miscarriage or stillbirth. Avoid all smoking, herbs, alcohol, and unprescribed drugs. Chemicals in these products can affect the formation and growth of the baby. Do not use any products that contain nicotine or tobacco, such as cigarettes and e-cigarettes. If you need help quitting, ask your health care provider. Visit your dentist if you have not gone yet during your pregnancy. Use a soft toothbrush to brush your teeth and be gentle when you floss. Contact a health care provider if: You have dizziness. You have mild pelvic cramps, pelvic pressure, or nagging pain in the abdominal area. You have persistent nausea, vomiting, or diarrhea. You have a bad smelling vaginal discharge. You have pain when you urinate. Get help right away if: You have a fever. You are leaking fluid from your vagina. You have spotting or bleeding from your vagina. You have severe abdominal cramping or pain. You have rapid weight gain or weight loss. You have shortness of breath with chest pain. You notice sudden or extreme swelling of your face, hands, ankles, feet, or legs. You  have not felt your baby move in over an hour. You have severe headaches that do not go away when you take medicine. You have vision changes. Summary The second trimester is from week 14 through week 27 (months 4 through 6). It is also a time when the fetus is growing rapidly. Your body goes through many changes during pregnancy. The changes vary from woman to woman. Avoid all smoking, herbs, alcohol, and unprescribed drugs. These chemicals affect the formation and growth your baby. Do not use any tobacco products, such as cigarettes, chewing tobacco, and e-cigarettes. If you need help quitting, ask your health care provider. Contact your health care provider if you have any questions. Keep all prenatal visits as told by your health care provider. This is important. This information is not intended to replace advice given to you by your health care provider. Make sure you discuss any questions you have with your health care provider. Document Released: 03/04/2001 Document Revised: 08/16/2015 Document Reviewed: 05/11/2012 Elsevier Interactive Patient Education  2017 [employer].

## 2024-02-15 NOTE — Progress Notes (Signed)
 LOW-RISK PREGNANCY VISIT Patient name: Veronica Allen MRN [id number]  Date of birth: Jul 28, 1995 Chief Complaint:   Routine Prenatal Visit  History of Present Illness:   Veronica Allen is a 28 y.o. G2P1001 female at [redacted]w[redacted]d with an Estimated Date of Delivery: 07/27/24 being seen today for ongoing management of a low-risk pregnancy.   Today she reports no complaints. Contractions: Not present.  .  Movement: Absent. denies leaking of fluid.     01/18/2024   11:45 AM 11/12/2023    2:28 PM 01/12/2023    9:32 AM 11/11/2022   11:15 AM 07/07/2022   10:13 AM  Depression screen PHQ 2/9  Decreased Interest 0 0 0 0 0  Down, Depressed, Hopeless 0 0 0 0 0  PHQ - 2 Score 0 0 0 0 0  Altered sleeping 0 0 0 0 0  Tired, decreased energy 0 0 0 0 0  Change in appetite 0 0 0 0 0  Feeling bad or failure about yourself  0 0 0 0 0  Trouble concentrating 0 0 0 0 0  Moving slowly or fidgety/restless 0 0 0 0 0  Suicidal thoughts 0 0 0 0 0  PHQ-9 Score 0  0  0  0  0      Data saved with a previous flowsheet row definition        01/18/2024   11:45 AM 11/12/2023    2:28 PM 01/12/2023    9:33 AM 11/11/2022   11:15 AM  GAD 7 : Generalized Anxiety Score  Nervous, Anxious, on Edge 1 1 0 1  Control/stop worrying 0 1 0 0  Worry too much - different things 1 1 0 0  Trouble relaxing 0 0 0 0  Restless 0 0 0 0  Easily annoyed or irritable 0 0 0 0  Afraid - awful might happen 0 1 0 0  Total GAD 7 Score 2 4 0 1  Anxiety Difficulty  Not difficult at all        Review of Systems:   Pertinent items are noted in HPI Denies abnormal vaginal discharge w/ itching/odor/irritation, headaches, visual changes, shortness of breath, chest pain, abdominal pain, severe nausea/vomiting, or problems with urination or bowel movements unless otherwise stated above. Pertinent History Reviewed:  Reviewed past medical,surgical, social, obstetrical and family history.  Reviewed problem list, medications and  allergies. Physical Assessment:   Vitals:   02/15/24 1157  BP: 129/81  Pulse: 99  Weight: 148 lb (67.1 kg)  Body mass index is 26.22 kg/m.        Physical Examination:   General appearance: Well appearing, and in no distress  Mental status: Alert, oriented to person, place, and time  Skin: Warm & dry  Cardiovascular: Normal heart rate noted  Respiratory: Normal respiratory effort, no distress  Abdomen: Soft, gravid, nontender  Pelvic: Cervical exam deferred         Extremities: Edema: None  Fetal Status: Fetal Heart Rate (bpm): 140   Movement: Absent    Chaperone: N/A No results found for this or any previous visit (from the past 24 hours).  Assessment & Plan:  1) Low-risk pregnancy G2P1001 at [redacted]w[redacted]d with an Estimated Date of Delivery: 07/27/24   2) H/O GHTN, ASA  3) Likely false +HepC> repeat today   Meds: No orders of the defined types were placed in this encounter.  Labs/procedures today: AFP and repeat hepC  Plan:  Continue routine obstetrical care  Next visit: prefers  will be in person for u/s    Reviewed: Preterm labor symptoms and general obstetric precautions including but not limited to vaginal bleeding, contractions, leaking of fluid and fetal movement were reviewed in detail with the patient.  All questions were answered. Does have home bp cuff. Office bp cuff given: not applicable. Check bp weekly, let us  know if consistently >140 and/or >90.  Follow-up: Return for As scheduled.  Future Appointments  Date Time Provider Department Center  03/10/2024  8:30 AM [facility] US  [facility] None  03/10/2024  9:30 AM Kizzie Suzen SAUNDERS, CNM [facility]  03/21/2024  9:00 AM [place], Erminio POUR, PA-C [facility] None    Orders Placed This Encounter  Procedures   US  OB Comp + 14 Wk   AFP, Serum, Open Spina Bifida   Hepatitis C Antibody   Suzen SAUNDERS Kizzie CNM, [facility] 02/15/2024 12:38 PM

## 2024-02-16 DIAGNOSIS — Z1379 Encounter for other screening for genetic and chromosomal anomalies: Secondary | ICD-10-CM | POA: Diagnosis not present

## 2024-02-17 ENCOUNTER — Ambulatory Visit: Payer: Self-pay | Admitting: Women's Health

## 2024-02-17 DIAGNOSIS — R7689 Other specified abnormal immunological findings in serum: Secondary | ICD-10-CM

## 2024-02-18 LAB — HCV RT-PCR, QUANT (NON-GRAPH)

## 2024-02-18 LAB — SPECIMEN STATUS REPORT

## 2024-02-20 LAB — AFP, SERUM, OPEN SPINA BIFIDA
AFP MoM: 1.13
AFP Value: 37.5 ng/mL
Gest. Age on Collection Date: 16 wk
Maternal Age At EDD: 29.5 a
OSBR Risk 1 IN: 8106
Test Results:: NEGATIVE
Weight: 148 [lb_av]

## 2024-02-20 LAB — HEPATITIS C ANTIBODY: Hep C Virus Ab: REACTIVE — AB

## 2024-02-22 DIAGNOSIS — R7689 Other specified abnormal immunological findings in serum: Secondary | ICD-10-CM | POA: Insufficient documentation

## 2024-02-23 ENCOUNTER — Other Ambulatory Visit: Payer: Self-pay | Admitting: Women's Health

## 2024-02-24 LAB — HCV RT-PCR, QUANT (NON-GRAPH): Hepatitis C Quantitation: NOT DETECTED [IU]/mL

## 2024-02-24 LAB — SPECIMEN STATUS REPORT

## 2024-02-24 LAB — HCV AB W REFLEX TO QUANT PCR: HCV Ab: REACTIVE — AB

## 2024-03-02 ENCOUNTER — Encounter: Payer: Self-pay | Admitting: Obstetrics & Gynecology

## 2024-03-10 ENCOUNTER — Ambulatory Visit: Admitting: Women's Health

## 2024-03-10 ENCOUNTER — Other Ambulatory Visit

## 2024-03-10 VITALS — BP 114/73 | HR 81 | Wt 153.0 lb

## 2024-03-10 DIAGNOSIS — Z3A20 20 weeks gestation of pregnancy: Secondary | ICD-10-CM | POA: Diagnosis not present

## 2024-03-10 DIAGNOSIS — Z8759 Personal history of other complications of pregnancy, childbirth and the puerperium: Secondary | ICD-10-CM | POA: Diagnosis not present

## 2024-03-10 DIAGNOSIS — Z363 Encounter for antenatal screening for malformations: Secondary | ICD-10-CM

## 2024-03-10 DIAGNOSIS — Z3482 Encounter for supervision of other normal pregnancy, second trimester: Secondary | ICD-10-CM

## 2024-03-10 DIAGNOSIS — Z3480 Encounter for supervision of other normal pregnancy, unspecified trimester: Secondary | ICD-10-CM

## 2024-03-10 NOTE — Patient Instructions (Signed)
Veronica Allen, thank you for choosing our office today! We appreciate the opportunity to meet your healthcare needs. You may receive a short survey by mail, e-mail, or through [employer]. If you are happy with your care we would appreciate if you could take just a few minutes to complete the survey questions. We read all of your comments and take your feedback very seriously. Thank you again for choosing our office.  Center for [employer] Team at [place] at [facility] ([place], [place] [address]) Entrance C, located off of [address] parking  Go to [email or web address] to register for FREE online childbirth classes  Call the office [phone]) or go to [facility] if: You begin to severe cramping Your water breaks.  Sometimes it is a big gush of fluid, sometimes it is just a trickle that keeps getting your panties wet or running down your legs You have vaginal bleeding.  It is normal to have a small amount of spotting if your cervix was checked.   [facility] Pediatricians/Family Doctors [facility]): [address] [place], [place] [facility]: [address] [place], [phone]                [facility]): [place], [phone] (call to ask if accepting patients) [facility] Department: [address], [place], [place] Pediatricians/Family Doctors [facility]): [place]. [place], [address], [place] [facility]: [address] [place], [place] [facility]: [place], [place] [facility]): [phone] [facility]: [address] [place]: [address] [place] [facility]: [phone], [phone]  Home Blood Pressure Monitoring for Patients   Your provider has recommended that you check your blood pressure (BP) at least once a week at home. If you do not have a blood pressure cuff at home, one will be provided for you. Contact your provider if you have not received your monitor within 1 week.   Helpful Tips for Accurate Home Blood Pressure Checks  Don't smoke, exercise, or drink caffeine 30 minutes before checking your BP Use the restroom before checking your BP (a full bladder can raise your pressure) Relax in a comfortable upright chair Feet on the ground Left arm resting comfortably on a flat surface at the level of your heart Legs uncrossed Back supported Sit quietly and don't talk Place the cuff on your bare arm Adjust snuggly, so that only two fingertips can fit between your skin and the top of the cuff Check 2 readings separated by at least one minute Keep a log of your BP readings For a visual, please reference this diagram: http://ccnc.care/bpdiagram  Provider Name: [facility]     Phone: [phone]  Zone 1: ALL CLEAR  Continue to monitor your symptoms:  BP reading is less than 140 (top number) or less than 90 (bottom number)  No right upper stomach pain No headaches or seeing spots No feeling nauseated or throwing up No swelling in face and hands  Zone 2: CAUTION Call your doctor's office for any of the following:  BP reading is greater than 140 (top number) or greater than  90 (bottom number)  Stomach pain under your ribs in the middle or right side Headaches or seeing spots Feeling nauseated or throwing up Swelling in face and hands  Zone 3: EMERGENCY  Seek immediate medical care if you have any of the following:  BP reading is greater than160 (top number) or greater than 110 (bottom number) Severe headaches not improving with Tylenol Serious difficulty catching your breath Any worsening symptoms from  Zone 2     Second Trimester of Pregnancy The second trimester is from week 14 through week 27 (months 4 through 6). The second trimester is often a time when you feel your best. Your body has adjusted to being pregnant, and you begin to feel better physically. Usually, morning sickness has lessened or quit completely, you may have more energy, and you may have an increase in appetite. The second trimester is also a time when the fetus is growing rapidly. At the end of the sixth month, the fetus is about 9 inches long and weighs about 1 pounds. You will likely begin to feel the baby move (quickening) between 16 and 20 weeks of pregnancy. Body changes during your second trimester Your body continues to go through many changes during your second trimester. The changes vary from woman to woman. Your weight will continue to increase. You will notice your lower abdomen bulging out. You may begin to get stretch marks on your hips, abdomen, and breasts. You may develop headaches that can be relieved by medicines. The medicines should be approved by your health care provider. You may urinate more often because the fetus is pressing on your bladder. You may develop or continue to have heartburn as a result of your pregnancy. You may develop constipation because certain hormones are causing the muscles that push waste through your intestines to slow down. You may develop hemorrhoids or swollen, bulging veins (varicose veins). You may have back pain. This is caused by: Weight gain. Pregnancy hormones that are relaxing the joints in your pelvis. A shift in weight and the muscles that support your balance. Your breasts will continue to grow and they will continue to become tender. Your gums may bleed and may be sensitive to brushing and flossing. Dark spots or blotches (chloasma, mask of pregnancy) may develop on your face. This will likely fade after the baby is born. A dark line from your belly button to  the pubic area (linea nigra) may appear. This will likely fade after the baby is born. You may have changes in your hair. These can include thickening of your hair, rapid growth, and changes in texture. Some women also have hair loss during or after pregnancy, or hair that feels dry or thin. Your hair will most likely return to normal after your baby is born.  What to expect at prenatal visits During a routine prenatal visit: You will be weighed to make sure you and the fetus are growing normally. Your blood pressure will be taken. Your abdomen will be measured to track your baby's growth. The fetal heartbeat will be listened to. Any test results from the previous visit will be discussed.  Your health care provider may ask you: How you are feeling. If you are feeling the baby move. If you have had any abnormal symptoms, such as leaking fluid, bleeding, severe headaches, or abdominal cramping. If you are using any tobacco products, including cigarettes, chewing tobacco, and electronic cigarettes. If you have any questions.  Other tests that may be performed during   your second trimester include: Blood tests that check for: Low iron levels (anemia). High blood sugar that affects pregnant women (gestational diabetes) between 41 and 28 weeks. Rh antibodies. This is to check for a protein on red blood cells (Rh factor). Urine tests to check for infections, diabetes, or protein in the urine. An ultrasound to confirm the proper growth and development of the baby. An amniocentesis to check for possible genetic problems. Fetal screens for spina bifida and Down syndrome. HIV (human immunodeficiency virus) testing. Routine prenatal testing includes screening for HIV, unless you choose not to have this test.  Follow these instructions at home: Medicines Follow your health care provider's instructions regarding medicine use. Specific medicines may be either safe or unsafe to take during  pregnancy. Take a prenatal vitamin that contains at least 600 micrograms (mcg) of folic acid. If you develop constipation, try taking a stool softener if your health care provider approves. Eating and drinking Eat a balanced diet that includes fresh fruits and vegetables, whole grains, good sources of protein such as meat, eggs, or tofu, and low-fat dairy. Your health care provider will help you determine the amount of weight gain that is right for you. Avoid raw meat and uncooked cheese. These carry germs that can cause birth defects in the baby. If you have low calcium intake from food, talk to your health care provider about whether you should take a daily calcium supplement. Limit foods that are high in fat and processed sugars, such as fried and sweet foods. To prevent constipation: Drink enough fluid to keep your urine clear or pale yellow. Eat foods that are high in fiber, such as fresh fruits and vegetables, whole grains, and beans. Activity Exercise only as directed by your health care provider. Most women can continue their usual exercise routine during pregnancy. Try to exercise for 30 minutes at least 5 days a week. Stop exercising if you experience uterine contractions. Avoid heavy lifting, wear low heel shoes, and practice good posture. A sexual relationship may be continued unless your health care provider directs you otherwise. Relieving pain and discomfort Wear a good support bra to prevent discomfort from breast tenderness. Take warm sitz baths to soothe any pain or discomfort caused by hemorrhoids. Use hemorrhoid cream if your health care provider approves. Rest with your legs elevated if you have leg cramps or low back pain. If you develop varicose veins, wear support hose. Elevate your feet for 15 minutes, 3-4 times a day. Limit salt in your diet. Prenatal Care Write down your questions. Take them to your prenatal visits. Keep all your prenatal visits as told by your health  care provider. This is important. Safety Wear your seat belt at all times when driving. Make a list of emergency phone numbers, including numbers for family, friends, the hospital, and police and fire departments. General instructions Ask your health care provider for a referral to a local prenatal education class. Begin classes no later than the beginning of month 6 of your pregnancy. Ask for help if you have counseling or nutritional needs during pregnancy. Your health care provider can offer advice or refer you to specialists for help with various needs. Do not use hot tubs, steam rooms, or saunas. Do not douche or use tampons or scented sanitary pads. Do not cross your legs for long periods of time. Avoid cat litter boxes and soil used by cats. These carry germs that can cause birth defects in the baby and possibly loss of the  fetus by miscarriage or stillbirth. Avoid all smoking, herbs, alcohol, and unprescribed drugs. Chemicals in these products can affect the formation and growth of the baby. Do not use any products that contain nicotine or tobacco, such as cigarettes and e-cigarettes. If you need help quitting, ask your health care provider. Visit your dentist if you have not gone yet during your pregnancy. Use a soft toothbrush to brush your teeth and be gentle when you floss. Contact a health care provider if: You have dizziness. You have mild pelvic cramps, pelvic pressure, or nagging pain in the abdominal area. You have persistent nausea, vomiting, or diarrhea. You have a bad smelling vaginal discharge. You have pain when you urinate. Get help right away if: You have a fever. You are leaking fluid from your vagina. You have spotting or bleeding from your vagina. You have severe abdominal cramping or pain. You have rapid weight gain or weight loss. You have shortness of breath with chest pain. You notice sudden or extreme swelling of your face, hands, ankles, feet, or legs. You  have not felt your baby move in over an hour. You have severe headaches that do not go away when you take medicine. You have vision changes. Summary The second trimester is from week 14 through week 27 (months 4 through 6). It is also a time when the fetus is growing rapidly. Your body goes through many changes during pregnancy. The changes vary from woman to woman. Avoid all smoking, herbs, alcohol, and unprescribed drugs. These chemicals affect the formation and growth your baby. Do not use any tobacco products, such as cigarettes, chewing tobacco, and e-cigarettes. If you need help quitting, ask your health care provider. Contact your health care provider if you have any questions. Keep all prenatal visits as told by your health care provider. This is important. This information is not intended to replace advice given to you by your health care provider. Make sure you discuss any questions you have with your health care provider. Document Released: 03/04/2001 Document Revised: 08/16/2015 Document Reviewed: 05/11/2012 Elsevier Interactive Patient Education  2017 [employer].

## 2024-03-10 NOTE — Progress Notes (Signed)
 LOW-RISK PREGNANCY VISIT Patient name: Veronica Allen MRN [id number]  Date of birth: 05-22-1995 Chief Complaint:   Routine Prenatal Visit (Anatomy scan today)  History of Present Illness:   Veronica Allen is a 28 y.o. G2P1001 female at [redacted]w[redacted]d with an Estimated Date of Delivery: 07/27/24 being seen today for ongoing management of a low-risk pregnancy.   Today she reports no complaints. Contractions: Not present.  .  Movement: Present. denies leaking of fluid.     01/18/2024   11:45 AM 11/12/2023    2:28 PM 01/12/2023    9:32 AM 11/11/2022   11:15 AM 07/07/2022   10:13 AM  Depression screen PHQ 2/9  Decreased Interest 0 0 0 0 0  Down, Depressed, Hopeless 0 0 0 0 0  PHQ - 2 Score 0 0 0 0 0  Altered sleeping 0 0 0 0 0  Tired, decreased energy 0 0 0 0 0  Change in appetite 0 0 0 0 0  Feeling bad or failure about yourself  0 0 0 0 0  Trouble concentrating 0 0 0 0 0  Moving slowly or fidgety/restless 0 0 0 0 0  Suicidal thoughts 0 0 0 0 0  PHQ-9 Score 0  0  0  0  0      Data saved with a previous flowsheet row definition        01/18/2024   11:45 AM 11/12/2023    2:28 PM 01/12/2023    9:33 AM 11/11/2022   11:15 AM  GAD 7 : Generalized Anxiety Score  Nervous, Anxious, on Edge 1 1 0 1  Control/stop worrying 0 1 0 0  Worry too much - different things 1 1 0 0  Trouble relaxing 0 0 0 0  Restless 0 0 0 0  Easily annoyed or irritable 0 0 0 0  Afraid - awful might happen 0 1 0 0  Total GAD 7 Score 2 4 0 1  Anxiety Difficulty  Not difficult at all        Review of Systems:   Pertinent items are noted in HPI Denies abnormal vaginal discharge w/ itching/odor/irritation, headaches, visual changes, shortness of breath, chest pain, abdominal pain, severe nausea/vomiting, or problems with urination or bowel movements unless otherwise stated above. Pertinent History Reviewed:  Reviewed past medical,surgical, social, obstetrical and family history.  Reviewed problem list,  medications and allergies. Physical Assessment:   Vitals:   03/10/24 0913  BP: 114/73  Pulse: 81  Weight: 153 lb (69.4 kg)  Body mass index is 27.1 kg/m.        Physical Examination:   General appearance: Well appearing, and in no distress  Mental status: Alert, oriented to person, place, and time  Skin: Warm & dry  Cardiovascular: Normal heart rate noted  Respiratory: Normal respiratory effort, no distress  Abdomen: Soft, gravid, nontender  Pelvic: Cervical exam deferred         Extremities: Edema: None  Fetal Status:     Movement: Present  US  20+1 wks,cephalic,posterior fundal placenta gr 0,normal ovaries,CX 3.1 cm,SVP of fluid 5.2 cm,FHR 142 bpm,EFW 303 g 20%,anatomy complete,no obvious abnormalities   Chaperone: N/A No results found for this or any previous visit (from the past 24 hours).  Assessment & Plan:  1) Low-risk pregnancy G2P1001 at [redacted]w[redacted]d with an Estimated Date of Delivery: 07/27/24   2) H/O GHTN, ASA   Meds: No orders of the defined types were placed in this encounter.  Labs/procedures today:  U/S  Plan:  Continue routine obstetrical care  Next visit: prefers in person    Reviewed: Preterm labor symptoms and general obstetric precautions including but not limited to vaginal bleeding, contractions, leaking of fluid and fetal movement were reviewed in detail with the patient.  All questions were answered. Does have home bp cuff. Office bp cuff given: not applicable. Check bp weekly, let us  know if consistently >140 and/or >90.  Follow-up: Return in about 4 weeks (around 04/07/2024) for LROB, CNM, in person.  Future Appointments  Date Time Provider Department Center  03/21/2024  9:00 AM Sandridge, Erminio POUR, PA-C [facility] None    No orders of the defined types were placed in this encounter.  Suzen JONELLE Fetters CNM, [facility] 03/10/2024 9:31 AM

## 2024-03-10 NOTE — Progress Notes (Signed)
 US  20+1 wks,cephalic,posterior fundal placenta gr 0,normal ovaries,CX 3.1 cm,SVP of fluid 5.2 cm,FHR 142 bpm,EFW 303 g 20%,anatomy complete,no obvious abnormalities

## 2024-03-21 ENCOUNTER — Ambulatory Visit: Admitting: Physician Assistant

## 2024-03-30 ENCOUNTER — Other Ambulatory Visit: Payer: Self-pay

## 2024-03-30 ENCOUNTER — Other Ambulatory Visit (HOSPITAL_BASED_OUTPATIENT_CLINIC_OR_DEPARTMENT_OTHER): Payer: Self-pay

## 2024-03-30 ENCOUNTER — Encounter: Payer: Self-pay | Admitting: Women's Health

## 2024-03-30 ENCOUNTER — Other Ambulatory Visit: Payer: Self-pay | Admitting: Nurse Practitioner

## 2024-03-30 ENCOUNTER — Ambulatory Visit: Admitting: Women's Health

## 2024-03-30 VITALS — BP 122/83 | HR 101 | Wt 156.6 lb

## 2024-03-30 DIAGNOSIS — O99891 Other specified diseases and conditions complicating pregnancy: Secondary | ICD-10-CM

## 2024-03-30 DIAGNOSIS — K219 Gastro-esophageal reflux disease without esophagitis: Secondary | ICD-10-CM | POA: Diagnosis not present

## 2024-03-30 DIAGNOSIS — Z3A23 23 weeks gestation of pregnancy: Secondary | ICD-10-CM | POA: Diagnosis not present

## 2024-03-30 DIAGNOSIS — O99612 Diseases of the digestive system complicating pregnancy, second trimester: Secondary | ICD-10-CM | POA: Diagnosis not present

## 2024-03-30 DIAGNOSIS — R059 Cough, unspecified: Secondary | ICD-10-CM | POA: Diagnosis not present

## 2024-03-30 DIAGNOSIS — Z3482 Encounter for supervision of other normal pregnancy, second trimester: Secondary | ICD-10-CM

## 2024-03-30 MED ORDER — ONDANSETRON 4 MG PO TBDP
4.0000 mg | ORAL_TABLET | Freq: Three times a day (TID) | ORAL | 1 refills | Status: AC | PRN
  Filled 2024-03-30: qty 24, 4d supply, fill #0

## 2024-03-30 MED ORDER — OMEPRAZOLE 20 MG PO CPDR
20.0000 mg | DELAYED_RELEASE_CAPSULE | Freq: Two times a day (BID) | ORAL | 3 refills | Status: AC
  Filled 2024-03-30: qty 60, 30d supply, fill #0

## 2024-03-30 NOTE — Progress Notes (Signed)
 "   LOW-RISK PREGNANCY VISIT Patient name: Veronica Allen MRN [id number]  Date of birth: [birth year] Chief Complaint:   Routine Prenatal Visit  History of Present Illness:   Veronica Allen is a 29 y.o. G2P1001 female at [redacted]w[redacted]d with an Estimated Date of Delivery: 07/27/24 being seen today for ongoing management of a low-risk pregnancy.   Today she reports coughing, thinks is from reflux. Omeprazole  20mg  not helping, eating TUMS..  Requests refill on zofran . Contractions: Not present. Vag. Bleeding: None.  Movement: Present. denies leaking of fluid.     01/18/2024   11:45 AM 11/12/2023    2:28 PM 01/12/2023    9:32 AM 11/11/2022   11:15 AM 07/07/2022   10:13 AM  Depression screen PHQ 2/9  Decreased Interest 0 0 0 0 0  Down, Depressed, Hopeless 0 0 0 0 0  PHQ - 2 Score 0 0 0 0 0  Altered sleeping 0 0 0 0 0  Tired, decreased energy 0 0 0 0 0  Change in appetite 0 0 0 0 0  Feeling bad or failure about yourself  0 0 0 0 0  Trouble concentrating 0 0 0 0 0  Moving slowly or fidgety/restless 0 0 0 0 0  Suicidal thoughts 0 0 0 0 0  PHQ-9 Score 0  0  0  0  0      Data saved with a previous flowsheet row definition        01/18/2024   11:45 AM 11/12/2023    2:28 PM 01/12/2023    9:33 AM 11/11/2022   11:15 AM  GAD 7 : Generalized Anxiety Score  Nervous, Anxious, on Edge 1 1 0 1  Control/stop worrying 0 1 0 0  Worry too much - different things 1 1 0 0  Trouble relaxing 0 0 0 0  Restless 0 0 0 0  Easily annoyed or irritable 0 0 0 0  Afraid - awful might happen 0 1 0 0  Total GAD 7 Score 2 4 0 1  Anxiety Difficulty  Not difficult at all        Review of Systems:   Pertinent items are noted in HPI Denies abnormal vaginal discharge w/ itching/odor/irritation, headaches, visual changes, shortness of breath, chest pain, abdominal pain, severe nausea/vomiting, or problems with urination or bowel movements unless otherwise stated above. Pertinent History Reviewed:  Reviewed past  medical,surgical, social, obstetrical and family history.  Reviewed problem list, medications and allergies. Physical Assessment:   Vitals:   03/30/24 1105  BP: 122/83  Pulse: (!) 101  Weight: 156 lb 9.6 oz (71 kg)  Body mass index is 27.74 kg/m.        Physical Examination:   General appearance: Well appearing, and in no distress  Mental status: Alert, oriented to person, place, and time  Skin: Warm & dry  Cardiovascular: Normal heart rate noted  Respiratory: Normal respiratory effort, no distress  Abdomen: Soft, gravid, nontender  Pelvic: Cervical exam deferred         Extremities: Edema: None  Fetal Status: Fetal Heart Rate (bpm): 150 Fundal Height: 24 cm Movement: Present    Chaperone: N/A No results found for this or any previous visit (from the past 24 hours).  Assessment & Plan:  1) Low-risk pregnancy G2P1001 at [redacted]w[redacted]d with an Estimated Date of Delivery: 07/27/24   2) Reflux/coughing, rx omeprazole  20mg  BID   Meds:  Meds ordered this encounter  Medications   ondansetron  (ZOFRAN -ODT) 4 MG  disintegrating tablet    Sig: Dissolve 1-2 tablets on the tongue three times daily if needed.    Dispense:  24 tablet    Refill:  1   omeprazole  (PRILOSEC) 20 MG capsule    Sig: Take 1 capsule (20 mg total) by mouth 2 (two) times daily before a meal.    Dispense:  60 capsule    Refill:  3   Labs/procedures today: none  Plan:  Continue routine obstetrical care  Next visit: prefers will be in person for pn2    Reviewed: Preterm labor symptoms and general obstetric precautions including but not limited to vaginal bleeding, contractions, leaking of fluid and fetal movement were reviewed in detail with the patient.  All questions were answered. Does have home bp cuff. Office bp cuff given: not applicable. Check bp weekly, let us  know if consistently >140 and/or >90.  Follow-up: Return for As scheduled.  Future Appointments  Date Time Provider Department Center  05/09/2024  8:30 AM  [facility]  05/09/2024  9:10 AM Kizzie Suzen SAUNDERS, CNM [facility]    No orders of the defined types were placed in this encounter.  Suzen SAUNDERS Kizzie CNM, [facility] 03/30/2024 11:24 AM  "

## 2024-03-30 NOTE — Patient Instructions (Signed)
Veronica Allen, thank you for choosing our office today! We appreciate the opportunity to meet your healthcare needs. You may receive a short survey by mail, e-mail, or through [employer]. If you are happy with your care we would appreciate if you could take just a few minutes to complete the survey questions. We read all of your comments and take your feedback very seriously. Thank you again for choosing our office.  Center for [employer] Team at [facility]  [facility] at [facility] ([address] [place], [place] [address]) Entrance C, located off of [address] Free 24/7 valet parking   You will have your sugar test next visit.  Please do not eat or drink anything after midnight the night before you come, not even water.  You will be here for at least two hours.  Please make an appointment online for the bloodwork at [email or web address] for 8:00am (or as close to this as possible). Make sure you select the [facility] service center.   CLASSES: Go to [email or web address] to register for classes (childbirth, breastfeeding, waterbirth, infant CPR, daddy bootcamp, etc.)  Call the office [phone]) or go to [facility] if: You begin to have strong, frequent contractions Your water breaks.  Sometimes it is a big gush of fluid, sometimes it is just a trickle that keeps getting your panties wet or running down your legs You have vaginal bleeding.  It is normal to have a small amount of spotting if your cervix was checked.  You don't feel your baby moving like normal.  If you don't, get you something to eat and drink and lay down and focus on feeling your baby move.   If your baby is still not moving like normal, you should call the office or go to [facility].  Call the office [phone]) or go to [facility] hospital for these signs of pre-eclampsia: Severe headache that does not go away with Tylenol Visual changes- seeing spots, double, blurred vision Pain under your right breast or upper  abdomen that does not go away with Tums or heartburn medicine Nausea and/or vomiting Severe swelling in your hands, feet, and face    [place] [facility]ians/Family Doctors [facility]): [address] Colette Ribas, [phone]           [facility]: [address], [phone]                [facility]): [address], [phone]  [facility]: [address], [place], [phone]    [facility] Pediatricians/Family Doctors [facility]): [address], [phone] [facility]: [address] [place], [phone] [facility]: [address], [phone]  [facility] Doctors  [facility]): [phone] [facility]: [address] [phone]   [facility] Doctors [facility]: [address] [phone]  [facility] Doctors  [facility]: [phone], [phone]  Home Blood Pressure Monitoring for Patients   Your provider has recommended that you check your blood pressure (BP) at least once a week at home. If you do not have a blood pressure cuff at home, one will be provided for you. Contact your provider if you have not received your monitor within 1 week.   Helpful Tips for Accurate Home Blood Pressure Checks  Don't smoke, exercise, or drink caffeine 30 minutes before checking  your BP Use the restroom before checking your BP (a full bladder can raise your pressure) Relax in a comfortable upright chair Feet on the ground Left arm resting comfortably on a flat surface at the level of your heart Legs uncrossed Back supported Sit quietly and don't talk Place the cuff on your bare arm Adjust snuggly, so that only two fingertips can fit between your skin and the top of the cuff Check 2 readings separated by at least one  minute Keep a log of your BP readings For a visual, please reference this diagram: http://ccnc.care/bpdiagram  Provider Name: [facility]     Phone: [phone]  Zone 1: ALL CLEAR  Continue to monitor your symptoms:  BP reading is less than 140 (top number) or less than 90 (bottom number)  No right upper stomach pain No headaches or seeing spots No feeling nauseated or throwing up No swelling in face and hands  Zone 2: CAUTION Call your doctor's office for any of the following:  BP reading is greater than 140 (top number) or greater than 90 (bottom number)  Stomach pain under your ribs in the middle or right side Headaches or seeing spots Feeling nauseated or throwing up Swelling in face and hands  Zone 3: EMERGENCY  Seek immediate medical care if you have any of the following:  BP reading is greater than160 (top number) or greater than 110 (bottom number) Severe headaches not improving with Tylenol Serious difficulty catching your breath Any worsening symptoms from Zone 2   Second Trimester of Pregnancy The second trimester is from week 13 through week 28, months 4 through 6. The second trimester is often a time when you feel your best. Your body has also adjusted to being pregnant, and you begin to feel better physically. Usually, morning sickness has lessened or quit completely, you may have more energy, and you may have an increase in appetite. The second trimester is also a time when the fetus is growing rapidly. At the end of the sixth month, the fetus is about 9 inches long and weighs about 1 pounds. You will likely begin to feel the baby move (quickening) between 18 and 20 weeks of the pregnancy. BODY CHANGES Your body goes through many changes during pregnancy. The changes vary from woman to woman.  Your weight will continue to increase. You will notice your lower abdomen bulging out. You may begin to get stretch marks on your hips, abdomen, and breasts. You may  develop headaches that can be relieved by medicines approved by your health care provider. You may urinate more often because the fetus is pressing on your bladder. You may develop or continue to have heartburn as a result of your pregnancy. You may develop constipation because certain hormones are causing the muscles that push waste through your intestines to slow down. You may develop hemorrhoids or swollen, bulging veins (varicose veins). You may have back pain because of the weight gain and pregnancy hormones relaxing your joints between the bones in your pelvis and as a result of a shift in weight and the muscles that support your balance. Your breasts will continue to grow and be tender. Your gums may bleed and may be sensitive to brushing and flossing. Dark spots or blotches (chloasma, mask of pregnancy) may develop on your face. This will likely fade after the baby is born. A dark line from your belly button to the pubic area (linea nigra) may appear. This will likely fade after the  baby is born. You may have changes in your hair. These can include thickening of your hair, rapid growth, and changes in texture. Some women also have hair loss during or after pregnancy, or hair that feels dry or thin. Your hair will most likely return to normal after your baby is born. WHAT TO EXPECT AT YOUR PRENATAL VISITS During a routine prenatal visit: You will be weighed to make sure you and the fetus are growing normally. Your blood pressure will be taken. Your abdomen will be measured to track your baby's growth. The fetal heartbeat will be listened to. Any test results from the previous visit will be discussed. Your health care provider may ask you: How you are feeling. If you are feeling the baby move. If you have had any abnormal symptoms, such as leaking fluid, bleeding, severe headaches, or abdominal cramping. If you have any questions. Other tests that may be performed during your second  trimester include: Blood tests that check for: Low iron levels (anemia). Gestational diabetes (between 24 and 28 weeks). Rh antibodies. Urine tests to check for infections, diabetes, or protein in the urine. An ultrasound to confirm the proper growth and development of the baby. An amniocentesis to check for possible genetic problems. Fetal screens for spina bifida and Down syndrome. HOME CARE INSTRUCTIONS  Avoid all smoking, herbs, alcohol, and unprescribed drugs. These chemicals affect the formation and growth of the baby. Follow your health care provider's instructions regarding medicine use. There are medicines that are either safe or unsafe to take during pregnancy. Exercise only as directed by your health care provider. Experiencing uterine cramps is a good sign to stop exercising. Continue to eat regular, healthy meals. Wear a good support bra for breast tenderness. Do not use hot tubs, steam rooms, or saunas. Wear your seat belt at all times when driving. Avoid raw meat, uncooked cheese, cat litter boxes, and soil used by cats. These carry germs that can cause birth defects in the baby. Take your prenatal vitamins. Try taking a stool softener (if your health care provider approves) if you develop constipation. Eat more high-fiber foods, such as fresh vegetables or fruit and whole grains. Drink plenty of fluids to keep your urine clear or pale yellow. Take warm sitz baths to soothe any pain or discomfort caused by hemorrhoids. Use hemorrhoid cream if your health care provider approves. If you develop varicose veins, wear support hose. Elevate your feet for 15 minutes, 3-4 times a day. Limit salt in your diet. Avoid heavy lifting, wear low heel shoes, and practice good posture. Rest with your legs elevated if you have leg cramps or low back pain. Visit your dentist if you have not gone yet during your pregnancy. Use a soft toothbrush to brush your teeth and be gentle when you floss. A  sexual relationship may be continued unless your health care provider directs you otherwise. Continue to go to all your prenatal visits as directed by your health care provider. SEEK MEDICAL CARE IF:  You have dizziness. You have mild pelvic cramps, pelvic pressure, or nagging pain in the abdominal area. You have persistent nausea, vomiting, or diarrhea. You have a bad smelling vaginal discharge. You have pain with urination. SEEK IMMEDIATE MEDICAL CARE IF:  You have a fever. You are leaking fluid from your vagina. You have spotting or bleeding from your vagina. You have severe abdominal cramping or pain. You have rapid weight gain or loss. You have shortness of breath with chest pain. You  notice sudden or extreme swelling of your face, hands, ankles, feet, or legs. You have not felt your baby move in over an hour. You have severe headaches that do not go away with medicine. You have vision changes. Document Released: 03/04/2001 Document Revised: 03/15/2013 Document Reviewed: 05/11/2012 [facility] Patient Information 2015 [place], [place]. This information is not intended to replace advice given to you by your health care provider. Make sure you discuss any questions you have with your health care provider.

## 2024-03-31 ENCOUNTER — Other Ambulatory Visit (HOSPITAL_BASED_OUTPATIENT_CLINIC_OR_DEPARTMENT_OTHER): Payer: Self-pay

## 2024-03-31 MED ORDER — VALACYCLOVIR HCL 1 G PO TABS
1000.0000 mg | ORAL_TABLET | Freq: Every day | ORAL | 1 refills | Status: AC
  Filled 2024-03-31 – 2024-04-13 (×2): qty 90, 90d supply, fill #0

## 2024-04-07 ENCOUNTER — Encounter: Admitting: Women's Health

## 2024-04-13 ENCOUNTER — Other Ambulatory Visit (HOSPITAL_BASED_OUTPATIENT_CLINIC_OR_DEPARTMENT_OTHER): Payer: Self-pay

## 2024-05-09 ENCOUNTER — Encounter: Admitting: Women's Health

## 2024-05-09 ENCOUNTER — Other Ambulatory Visit

## 2024-05-09 DIAGNOSIS — Z3A28 28 weeks gestation of pregnancy: Secondary | ICD-10-CM

## 2024-05-09 DIAGNOSIS — Z3480 Encounter for supervision of other normal pregnancy, unspecified trimester: Secondary | ICD-10-CM

## 2024-05-09 DIAGNOSIS — Z131 Encounter for screening for diabetes mellitus: Secondary | ICD-10-CM
